# Patient Record
Sex: Female | Born: 1960
Health system: Southern US, Community
[De-identification: ages and names within clinical notes are randomized; demographics above are authoritative.]

## PROBLEM LIST (undated history)

## (undated) DIAGNOSIS — M797 Fibromyalgia: Secondary | ICD-10-CM

## (undated) DIAGNOSIS — I639 Cerebral infarction, unspecified: Secondary | ICD-10-CM

## (undated) DIAGNOSIS — R51 Headache: Secondary | ICD-10-CM

## (undated) DIAGNOSIS — F419 Anxiety disorder, unspecified: Secondary | ICD-10-CM

## (undated) DIAGNOSIS — H9190 Unspecified hearing loss, unspecified ear: Secondary | ICD-10-CM

## (undated) DIAGNOSIS — F32A Depression, unspecified: Secondary | ICD-10-CM

## (undated) DIAGNOSIS — I10 Essential (primary) hypertension: Secondary | ICD-10-CM

## (undated) DIAGNOSIS — F329 Major depressive disorder, single episode, unspecified: Secondary | ICD-10-CM

## (undated) DIAGNOSIS — E785 Hyperlipidemia, unspecified: Secondary | ICD-10-CM

## (undated) DIAGNOSIS — G4730 Sleep apnea, unspecified: Secondary | ICD-10-CM

## (undated) DIAGNOSIS — R519 Headache, unspecified: Secondary | ICD-10-CM

## (undated) DIAGNOSIS — E119 Type 2 diabetes mellitus without complications: Secondary | ICD-10-CM

## (undated) DIAGNOSIS — H409 Unspecified glaucoma: Secondary | ICD-10-CM

## (undated) HISTORY — DX: Unspecified glaucoma: H40.9

## (undated) HISTORY — DX: Fibromyalgia: M79.7

## (undated) HISTORY — DX: Anxiety disorder, unspecified: F41.9

## (undated) HISTORY — DX: Depression, unspecified: F32.A

## (undated) HISTORY — PX: EYE SURGERY: SHX253

## (undated) HISTORY — DX: Hyperlipidemia, unspecified: E78.5

## (undated) HISTORY — DX: Cerebral infarction, unspecified: I63.9

## (undated) HISTORY — DX: Sleep apnea, unspecified: G47.30

## (undated) HISTORY — DX: Major depressive disorder, single episode, unspecified: F32.9

## (undated) HISTORY — DX: Unspecified hearing loss, unspecified ear: H91.90

## (undated) LAB — HEPATIC FUNCTION PANEL
ALT: 21 (ref 7–35)
AST: 14 (ref 13–35)
Alkaline Phosphatase: 88 (ref 25–125)
BILIRUBIN, TOTAL: 0.2

## (undated) LAB — CBC AND DIFFERENTIAL
HCT: 40 (ref 36–46)
Hemoglobin: 13.8 (ref 12.0–16.0)
Platelets: 229 (ref 150–399)
WBC: 9.4

## (undated) LAB — HEMOGLOBIN A1C: HEMOGLOBIN A1C: 6.7

## (undated) LAB — LIPID PANEL
Cholesterol: 203 — AB (ref 0–200)
HDL: 59 (ref 35–70)
LDL Cholesterol: 119
Triglycerides: 127 (ref 40–160)

## (undated) LAB — BASIC METABOLIC PANEL
BUN: 20 (ref 4–21)
CREATININE: 0.9 (ref 0.5–1.1)
GLUCOSE: 119
POTASSIUM: 5 (ref 3.4–5.3)
Sodium: 141 (ref 137–147)

---
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
## 1980-03-26 HISTORY — PX: CHOLECYSTECTOMY: SHX55

## 1980-03-26 HISTORY — PX: APPENDECTOMY: SHX54

## 2005-03-26 HISTORY — PX: STAPEDES SURGERY: SHX789

## 2009-12-14 DIAGNOSIS — R7309 Other abnormal glucose: Secondary | ICD-10-CM | POA: Insufficient documentation

## 2014-03-26 HISTORY — PX: SHUNT REPLACEMENT: SHX5403

## 2014-11-05 DIAGNOSIS — G932 Benign intracranial hypertension: Secondary | ICD-10-CM | POA: Insufficient documentation

## 2015-03-15 DIAGNOSIS — R935 Abnormal findings on diagnostic imaging of other abdominal regions, including retroperitoneum: Secondary | ICD-10-CM | POA: Insufficient documentation

## 2015-03-22 DIAGNOSIS — Z982 Presence of cerebrospinal fluid drainage device: Secondary | ICD-10-CM | POA: Insufficient documentation

## 2015-04-12 LAB — LIPID PANEL
Cholesterol: 245 — AB (ref 0–200)
HDL: 51 (ref 35–70)
LDL CALC: 153
TRIGLYCERIDES: 206 — AB (ref 40–160)

## 2015-04-12 LAB — HEPATIC FUNCTION PANEL
ALT: 19 (ref 7–35)
AST: 18 (ref 13–35)
Alkaline Phosphatase: 75 (ref 25–125)
BILIRUBIN, TOTAL: 0.2

## 2015-04-12 LAB — BASIC METABOLIC PANEL
BUN: 14 (ref 4–21)
Creatinine: 0.8 (ref <=1.1)
Glucose: 181
POTASSIUM: 4.3 (ref 3.4–5.3)
SODIUM: 141 (ref 137–147)

## 2015-04-12 LAB — CBC AND DIFFERENTIAL
HEMATOCRIT: 41 (ref 36–46)
Hemoglobin: 13.4 (ref 12.0–16.0)
Platelets: 238 (ref 150–399)
WBC: 10.5

## 2015-04-12 LAB — HEMOGLOBIN A1C: HEMOGLOBIN A1C: 7.9

## 2015-07-12 LAB — CBC AND DIFFERENTIAL
HEMATOCRIT: 41 (ref 36–46)
HEMOGLOBIN: 13.9 (ref 12.0–16.0)
PLATELETS: 216 (ref 150–399)
WBC: 8.4

## 2015-07-12 LAB — BASIC METABOLIC PANEL
BUN: 18 (ref 4–21)
Creatinine: 0.9 (ref <=1.1)
GLUCOSE: 146
Potassium: 4.3 (ref 3.4–5.3)
Sodium: 140 (ref 137–147)

## 2015-07-12 LAB — LIPID PANEL
CHOLESTEROL: 197 (ref 0–200)
HDL: 57 (ref 35–70)
LDL CALC: 117
Triglycerides: 113 (ref 40–160)

## 2015-07-12 LAB — HEPATIC FUNCTION PANEL
ALK PHOS: 75 (ref 25–125)
ALT: 16 (ref 7–35)
AST: 17 (ref 13–35)
Bilirubin, Total: 0.3

## 2015-07-12 LAB — HEMOGLOBIN A1C: Hemoglobin A1C: 7.5

## 2015-10-25 HISTORY — PX: VENTRICULOPERITONEAL SHUNT: SHX204

## 2016-01-17 LAB — CBC AND DIFFERENTIAL
HCT: 40 (ref 36–46)
Hemoglobin: 13.8 (ref 12.0–16.0)
PLATELETS: 229 (ref 150–399)
WBC: 9.4

## 2016-01-17 LAB — BASIC METABOLIC PANEL
BUN: 20 (ref 4–21)
CREATININE: 0.9 (ref <=1.1)
GLUCOSE: 119
Potassium: 5 (ref 3.4–5.3)
Sodium: 141 (ref 137–147)

## 2016-01-17 LAB — HEPATIC FUNCTION PANEL
ALK PHOS: 88 (ref 25–125)
ALT: 21 (ref 7–35)
AST: 14 (ref 13–35)
Bilirubin, Total: 0.2

## 2016-01-17 LAB — LIPID PANEL
Cholesterol: 203 — AB (ref 0–200)
HDL: 59 (ref 35–70)
LDL Cholesterol: 119
Triglycerides: 127 (ref 40–160)

## 2016-01-17 LAB — HEMOGLOBIN A1C: Hemoglobin A1C: 6.7

## 2016-01-18 LAB — CREATININE, URINE, RANDOM
CREATININE UR: 60.3 mg/dL
Microalbumin, Urine: <3

## 2016-03-26 HISTORY — PX: SHUNT REVISION: SHX343

## 2016-06-10 ENCOUNTER — Emergency Department (HOSPITAL_COMMUNITY): Payer: Self-pay

## 2016-06-10 ENCOUNTER — Inpatient Hospital Stay (HOSPITAL_COMMUNITY)
Admission: EM | Admit: 2016-06-10 | Discharge: 2016-06-12 | DRG: 062 | Disposition: A | Discharge status: Home / Self Care | Payer: Medicare Other | Attending: Neurology | Admitting: Neurology

## 2016-06-10 ENCOUNTER — Encounter (HOSPITAL_COMMUNITY): Payer: Self-pay | Admitting: Emergency Medicine

## 2016-06-10 DIAGNOSIS — Z79899 Other long term (current) drug therapy: Secondary | ICD-10-CM

## 2016-06-10 DIAGNOSIS — Z886 Allergy status to analgesic agent status: Secondary | ICD-10-CM

## 2016-06-10 DIAGNOSIS — Z23 Encounter for immunization: Secondary | ICD-10-CM

## 2016-06-10 DIAGNOSIS — R29704 NIHSS score 4: Secondary | ICD-10-CM | POA: Diagnosis present

## 2016-06-10 DIAGNOSIS — R402252 Coma scale, best verbal response, oriented, at arrival to emergency department: Secondary | ICD-10-CM | POA: Diagnosis present

## 2016-06-10 DIAGNOSIS — I160 Hypertensive urgency: Secondary | ICD-10-CM | POA: Diagnosis present

## 2016-06-10 DIAGNOSIS — I63311 Cerebral infarction due to thrombosis of right middle cerebral artery: Secondary | ICD-10-CM

## 2016-06-10 DIAGNOSIS — G919 Hydrocephalus, unspecified: Secondary | ICD-10-CM | POA: Diagnosis present

## 2016-06-10 DIAGNOSIS — E119 Type 2 diabetes mellitus without complications: Secondary | ICD-10-CM | POA: Diagnosis present

## 2016-06-10 DIAGNOSIS — R4781 Slurred speech: Secondary | ICD-10-CM | POA: Diagnosis present

## 2016-06-10 DIAGNOSIS — Y92239 Unspecified place in hospital as the place of occurrence of the external cause: Secondary | ICD-10-CM | POA: Diagnosis not present

## 2016-06-10 DIAGNOSIS — I639 Cerebral infarction, unspecified: Secondary | ICD-10-CM

## 2016-06-10 DIAGNOSIS — R402362 Coma scale, best motor response, obeys commands, at arrival to emergency department: Secondary | ICD-10-CM | POA: Diagnosis present

## 2016-06-10 DIAGNOSIS — Z7984 Long term (current) use of oral hypoglycemic drugs: Secondary | ICD-10-CM

## 2016-06-10 DIAGNOSIS — Z982 Presence of cerebrospinal fluid drainage device: Secondary | ICD-10-CM

## 2016-06-10 DIAGNOSIS — E1159 Type 2 diabetes mellitus with other circulatory complications: Secondary | ICD-10-CM

## 2016-06-10 DIAGNOSIS — R402142 Coma scale, eyes open, spontaneous, at arrival to emergency department: Secondary | ICD-10-CM | POA: Diagnosis present

## 2016-06-10 DIAGNOSIS — T45615A Adverse effect of thrombolytic drugs, initial encounter: Secondary | ICD-10-CM | POA: Diagnosis not present

## 2016-06-10 DIAGNOSIS — R2990 Unspecified symptoms and signs involving the nervous system: Secondary | ICD-10-CM | POA: Diagnosis present

## 2016-06-10 DIAGNOSIS — I69354 Hemiplegia and hemiparesis following cerebral infarction affecting left non-dominant side: Secondary | ICD-10-CM

## 2016-06-10 DIAGNOSIS — I10 Essential (primary) hypertension: Secondary | ICD-10-CM | POA: Diagnosis present

## 2016-06-10 DIAGNOSIS — Z6841 Body Mass Index (BMI) 40.0 and over, adult: Secondary | ICD-10-CM

## 2016-06-10 DIAGNOSIS — R600 Localized edema: Secondary | ICD-10-CM | POA: Diagnosis not present

## 2016-06-10 DIAGNOSIS — E1165 Type 2 diabetes mellitus with hyperglycemia: Secondary | ICD-10-CM

## 2016-06-10 DIAGNOSIS — F419 Anxiety disorder, unspecified: Secondary | ICD-10-CM | POA: Diagnosis present

## 2016-06-10 DIAGNOSIS — R29810 Facial weakness: Secondary | ICD-10-CM | POA: Diagnosis present

## 2016-06-10 DIAGNOSIS — E785 Hyperlipidemia, unspecified: Secondary | ICD-10-CM | POA: Diagnosis present

## 2016-06-10 HISTORY — DX: Headache: R51

## 2016-06-10 HISTORY — DX: Type 2 diabetes mellitus without complications: E11.9

## 2016-06-10 HISTORY — DX: Essential (primary) hypertension: I10

## 2016-06-10 HISTORY — DX: Headache, unspecified: R51.9

## 2016-06-10 LAB — DIFFERENTIAL
BASOS PCT: 1 %
Basophils Absolute: 0 10*3/uL (ref 0.0–0.1)
EOS ABS: 0.2 10*3/uL (ref 0.0–0.7)
Eosinophils Relative: 2 %
LYMPHS ABS: 3.3 10*3/uL (ref 0.7–4.0)
Lymphocytes Relative: 45 %
MONO ABS: 0.8 10*3/uL (ref 0.1–1.0)
MONOS PCT: 11 %
NEUTROS ABS: 3 10*3/uL (ref 1.7–7.7)
Neutrophils Relative %: 41 %

## 2016-06-10 LAB — I-STAT CHEM 8, ED
BUN: 18 mg/dL (ref 6–20)
CHLORIDE: 106 mmol/L (ref 101–111)
Calcium, Ion: 1.05 mmol/L — ABNORMAL LOW (ref 1.15–1.40)
Creatinine, Ser: 1 mg/dL (ref 0.44–1.00)
Glucose, Bld: 162 mg/dL — ABNORMAL HIGH (ref 65–99)
HCT: 36 % (ref 36.0–46.0)
Hemoglobin: 12.2 g/dL (ref 12.0–15.0)
POTASSIUM: 3.9 mmol/L (ref 3.5–5.1)
SODIUM: 140 mmol/L (ref 135–145)
TCO2: 27 mmol/L (ref 0–100)

## 2016-06-10 LAB — PROTIME-INR
INR: 1.02
Prothrombin Time: 13.4 seconds (ref 11.4–15.2)

## 2016-06-10 LAB — RAPID URINE DRUG SCREEN, HOSP PERFORMED
AMPHETAMINES: NOT DETECTED
BENZODIAZEPINES: NOT DETECTED
Barbiturates: NOT DETECTED
Cocaine: NOT DETECTED
OPIATES: NOT DETECTED
Tetrahydrocannabinol: NOT DETECTED

## 2016-06-10 LAB — URINALYSIS, ROUTINE W REFLEX MICROSCOPIC
Bilirubin Urine: NEGATIVE
Glucose, UA: NEGATIVE mg/dL
Hgb urine dipstick: NEGATIVE
Ketones, ur: NEGATIVE mg/dL
Leukocytes, UA: NEGATIVE
Nitrite: NEGATIVE
PROTEIN: NEGATIVE mg/dL
RBC / HPF: NONE SEEN RBC/hpf (ref 0–5)
Specific Gravity, Urine: 1.006 (ref 1.005–1.030)
pH: 6 (ref 5.0–8.0)

## 2016-06-10 LAB — CBG MONITORING, ED: GLUCOSE-CAPILLARY: 155 mg/dL — AB (ref 65–99)

## 2016-06-10 LAB — CBC
HEMATOCRIT: 38 % (ref 36.0–46.0)
HEMOGLOBIN: 12.5 g/dL (ref 12.0–15.0)
MCH: 29.4 pg (ref 26.0–34.0)
MCHC: 32.9 g/dL (ref 30.0–36.0)
MCV: 89.4 fL (ref 78.0–100.0)
Platelets: 168 10*3/uL (ref 150–400)
RBC: 4.25 MIL/uL (ref 3.87–5.11)
RDW: 13.5 % (ref 11.5–15.5)
WBC: 7.2 10*3/uL (ref 4.0–10.5)

## 2016-06-10 LAB — GLUCOSE, CAPILLARY
Glucose-Capillary: 89 mg/dL (ref 65–99)
Glucose-Capillary: 305 mg/dL — ABNORMAL HIGH (ref 65–99)

## 2016-06-10 LAB — COMPREHENSIVE METABOLIC PANEL
ALT: 20 U/L (ref 14–54)
AST: 23 U/L (ref 15–41)
Albumin: 3.6 g/dL (ref 3.5–5.0)
Alkaline Phosphatase: 52 U/L (ref 38–126)
Anion gap: 12 (ref 5–15)
BILIRUBIN TOTAL: 0.2 mg/dL — AB (ref 0.3–1.2)
BUN: 15 mg/dL (ref 6–20)
CALCIUM: 8.8 mg/dL — AB (ref 8.9–10.3)
CO2: 20 mmol/L — AB (ref 22–32)
Chloride: 106 mmol/L (ref 101–111)
Creatinine, Ser: 1.11 mg/dL — ABNORMAL HIGH (ref 0.44–1.00)
GFR calc non Af Amer: 54 mL/min — ABNORMAL LOW (ref >60)
GLUCOSE: 165 mg/dL — AB (ref 65–99)
POTASSIUM: 3.8 mmol/L (ref 3.5–5.1)
Sodium: 138 mmol/L (ref 135–145)
Total Protein: 6.2 g/dL — ABNORMAL LOW (ref 6.5–8.1)

## 2016-06-10 LAB — I-STAT TROPONIN, ED: Troponin i, poc: 0 ng/mL (ref 0.00–0.08)

## 2016-06-10 LAB — ETHANOL: Alcohol, Ethyl (B): <5 mg/dL (ref <5)

## 2016-06-10 LAB — MRSA PCR SCREENING: MRSA by PCR: NEGATIVE

## 2016-06-10 LAB — APTT: APTT: 27 s (ref 24–36)

## 2016-06-10 MED ORDER — ALTEPLASE (STROKE) FULL DOSE INFUSION
90.0000 mg | Freq: Once | INTRAVENOUS | Status: AC
  Administered 2016-06-10: 90 mg via INTRAVENOUS
  Filled 2016-06-10: qty 100

## 2016-06-10 MED ORDER — STROKE: EARLY STAGES OF RECOVERY BOOK
Freq: Once | Status: AC
  Administered 2016-06-10: 14:00:00
  Filled 2016-06-10: qty 1

## 2016-06-10 MED ORDER — ACETAMINOPHEN 325 MG PO TABS
650.0000 mg | ORAL_TABLET | ORAL | Status: DC | PRN
  Administered 2016-06-10: 650 mg via ORAL
  Filled 2016-06-10: qty 2

## 2016-06-10 MED ORDER — METHYLPREDNISOLONE SODIUM SUCC 125 MG IJ SOLR
60.0000 mg | Freq: Once | INTRAMUSCULAR | Status: AC
  Administered 2016-06-10: 60 mg via INTRAVENOUS

## 2016-06-10 MED ORDER — DIPHENHYDRAMINE HCL 50 MG/ML IJ SOLN
25.0000 mg | Freq: Once | INTRAMUSCULAR | Status: AC
  Administered 2016-06-10: 25 mg via INTRAVENOUS

## 2016-06-10 MED ORDER — SODIUM CHLORIDE 0.9 % IV SOLN
50.0000 mL | Freq: Once | INTRAVENOUS | Status: AC
  Administered 2016-06-10: 50 mL via INTRAVENOUS

## 2016-06-10 MED ORDER — PANTOPRAZOLE SODIUM 40 MG IV SOLR: 40.0000 mg | Freq: Every day | INTRAVENOUS | Status: DC

## 2016-06-10 MED ORDER — LORAZEPAM 1 MG PO TABS
1.0000 mg | ORAL_TABLET | Freq: Two times a day (BID) | ORAL | Status: DC | PRN
  Administered 2016-06-10: 1 mg via ORAL
  Filled 2016-06-10: qty 1

## 2016-06-10 MED ORDER — ACETAMINOPHEN 160 MG/5ML PO SOLN: 650.0000 mg | ORAL | Status: DC | PRN

## 2016-06-10 MED ORDER — PNEUMOCOCCAL VAC POLYVALENT 25 MCG/0.5ML IJ INJ
0.5000 mL | INJECTION | INTRAMUSCULAR | Status: AC | PRN
  Administered 2016-06-12: 0.5 mL via INTRAMUSCULAR
  Filled 2016-06-10: qty 0.5

## 2016-06-10 MED ORDER — BACLOFEN 10 MG PO TABS
10.0000 mg | ORAL_TABLET | Freq: Three times a day (TID) | ORAL | Status: DC | PRN
  Administered 2016-06-10 – 2016-06-11 (×2): 10 mg via ORAL
  Filled 2016-06-10 (×2): qty 1

## 2016-06-10 MED ORDER — PREGABALIN 50 MG PO CAPS
100.0000 mg | ORAL_CAPSULE | Freq: Two times a day (BID) | ORAL | Status: DC
  Administered 2016-06-11 – 2016-06-12 (×3): 100 mg via ORAL
  Filled 2016-06-10 (×4): qty 2

## 2016-06-10 MED ORDER — INSULIN ASPART 100 UNIT/ML ~~LOC~~ SOLN
0.0000 [IU] | Freq: Three times a day (TID) | SUBCUTANEOUS | Status: DC
Start: 2016-06-11 — End: 2016-06-12
  Administered 2016-06-11: 3 [IU] via SUBCUTANEOUS
  Administered 2016-06-11 – 2016-06-12 (×2): 2 [IU] via SUBCUTANEOUS

## 2016-06-10 MED ORDER — ACETAMINOPHEN 650 MG RE SUPP: 650.0000 mg | RECTAL | Status: DC | PRN

## 2016-06-10 MED ORDER — DIPHENHYDRAMINE HCL 50 MG/ML IJ SOLN
INTRAMUSCULAR | Status: AC
  Filled 2016-06-10: qty 1

## 2016-06-10 MED ORDER — PANTOPRAZOLE SODIUM 40 MG PO TBEC
40.0000 mg | DELAYED_RELEASE_TABLET | Freq: Every day | ORAL | Status: DC
  Administered 2016-06-10 – 2016-06-12 (×3): 40 mg via ORAL
  Filled 2016-06-10 (×3): qty 1

## 2016-06-10 MED ORDER — WHITE PETROLATUM GEL
Status: AC
  Filled 2016-06-10: qty 1

## 2016-06-10 MED ORDER — INSULIN ASPART 100 UNIT/ML ~~LOC~~ SOLN
0.0000 [IU] | Freq: Every day | SUBCUTANEOUS | Status: DC
  Administered 2016-06-10: 4 [IU] via SUBCUTANEOUS

## 2016-06-10 MED ORDER — METHYLPREDNISOLONE SODIUM SUCC 125 MG IJ SOLR
INTRAMUSCULAR | Status: AC
  Filled 2016-06-10: qty 2

## 2016-06-10 NOTE — Progress Notes (Signed)
Pt c/o swollen lips- bottom lip noted to be edematous. tpa completed already. MD notified, saw patient at bedside, ordered benadryl and solu medrol IV STAT. Will cont to monitor.

## 2016-06-10 NOTE — ED Notes (Signed)
Report given to Tammy RN  

## 2016-06-10 NOTE — ED Notes (Signed)
Patient presents to the ED with complaints of Left side weakness, facial droop, and slurred speech. Patient reports started at 1100. Patient complains of Headache behind the eye and back of head. Patient alert and oriented. Per EMS patient Diabetic Patient CBG initial  65 given d10 CBG 193. Patient initial BP with EMS 210/140. Patient does have Shunt placed on right side in 2016. Patient reports she gets headache with shunt but does not feel the same.

## 2016-06-10 NOTE — ED Provider Notes (Signed)
[facility]EMERGENCY DEPT Provider Note   CSN: [id number] Arrival date & time: 06/10/16  1206     History   Chief Complaint No chief complaint on file.   HPI Melissa Rosario is a 56 y.o. female. Chief complaint is "code stroke". Left-sided weakness.  HPI:  56 year female history of hypertension, hydrocephalus, status post VP shunt placement August 2017 at [facility] in [place]  in [place].   Last time known normal was 11:00. Patient describes left-sided headache. Left arm and leg weakness. Paramedics describe facial droop.  Prehospital blood sugar was 68. Given D10. No change in symptoms. Hypertensive 180/110. No treatment initiated prehospital. Sinus rhythm.  No past medical history on file.  There are no active problems to display for this patient.   No past surgical history on file.  OB History    No data available       Home Medications    Prior to Admission medications   Medication Sig Start Date End Date Taking? Authorizing Provider  baclofen (LIORESAL) 10 MG tablet Take 10 mg by mouth 3 (three) times daily.   Yes Historical Provider, MD  glipiZIDE (GLUCOTROL) 10 MG tablet Take 10 mg by mouth daily before breakfast.   Yes Historical Provider, MD  lisinopril (PRINIVIL,ZESTRIL) 10 MG tablet Take 10 mg by mouth daily.   Yes Historical Provider, MD  pregabalin (LYRICA) 100 MG capsule Take 100 mg by mouth 2 (two) times daily.   Yes Historical Provider, MD    Family History No family history on file.  Social History Social History  Substance Use Topics  . Smoking status: Not on file  . Smokeless tobacco: Not on file  . Alcohol use Not on file     Allergies   Patient has no allergy information on record.   Review of Systems Review of Systems  Constitutional: Negative for appetite change, chills, diaphoresis, fatigue and fever.  HENT: Negative for mouth sores, sore throat and trouble swallowing.   Eyes: Negative for visual disturbance.  Respiratory: Negative  for cough, chest tightness, shortness of breath and wheezing.   Cardiovascular: Negative for chest pain.  Gastrointestinal: Negative for abdominal distention, abdominal pain, diarrhea, nausea and vomiting.  Endocrine: Negative for polydipsia, polyphagia and polyuria.  Genitourinary: Negative for dysuria, frequency and hematuria.  Musculoskeletal: Negative for gait problem.  Skin: Negative for color change, pallor and rash.  Neurological: Negative for dizziness, syncope, light-headedness and headaches.       Left arm and leg weakness. No fall. No seizure.  Hematological: Does not bruise/bleed easily.  Psychiatric/Behavioral: Negative for behavioral problems and confusion.     Physical Exam Updated Vital Signs BP (!) 177/92 (BP Location: Right Arm)   Pulse 76   Resp 14   SpO2 99%   Physical Exam  Constitutional: She is oriented to person, place, and time. She appears well-developed and well-nourished. No distress.  HENT:  Head: Normocephalic.  Eyes: Conjunctivae are normal. Pupils are equal, round, and reactive to light. No scleral icterus.  Neck: Normal range of motion. Neck supple. No thyromegaly present.  Cardiovascular: Normal rate and regular rhythm.  Exam reveals no gallop and no friction rub.   No murmur heard. Pulmonary/Chest: Effort normal and breath sounds normal. No respiratory distress. She has no wheezes. She has no rales.  Abdominal: Soft. Bowel sounds are normal. She exhibits no distension. There is no tenderness. There is no rebound.  Musculoskeletal: Normal range of motion.  Neurological: She is alert and oriented to person, place, and  time. GCS eye subscore is 4. GCS verbal subscore is 5. GCS motor subscore is 6.  Subtle Left lower facial droop. Has left arm drift. No obvious leg drift. Legs 4 over 5 bilaterally.  Skin: Skin is warm and dry. No rash noted.  Psychiatric: She has a normal mood and affect. Her behavior is normal.     ED Treatments / Results   Labs (all labs ordered are listed, but only abnormal results are displayed) Labs Reviewed  CBG MONITORING, ED - Abnormal; Notable for the following:       Result Value   Glucose-Capillary 155 (*)    All other components within normal limits  I-STAT CHEM 8, ED - Abnormal; Notable for the following:    Glucose, Bld 162 (*)    Calcium, Ion 1.05 (*)    All other components within normal limits  PROTIME-INR  APTT  CBC  DIFFERENTIAL  COMPREHENSIVE METABOLIC PANEL  ETHANOL  RAPID URINE DRUG SCREEN, HOSP PERFORMED  URINALYSIS, ROUTINE W REFLEX MICROSCOPIC  I-STAT TROPOININ, ED  I-STAT CHEM 8, ED  I-STAT TROPOININ, ED    EKG  EKG Interpretation None       Radiology Ct Head Code Stroke W/o Cm  Result Date: 06/10/2016 CLINICAL DATA:  Code stroke. Left-sided weakness. Right facial droop. Slurred speech. Last seen normal 90 minutes ago. EXAM: CT HEAD WITHOUT CONTRAST TECHNIQUE: Contiguous axial images were obtained from the base of the skull through the vertex without intravenous contrast. COMPARISON:  None. FINDINGS: Brain: No sign of acute infarction. No mass lesion, hemorrhage, hydrocephalus or extra-axial collection. VP shunt enters from a right frontal approach. Vascular: No abnormal vascular finding. Skull: Negative Sinuses/Orbits: Clear/normal Other: None ASPECTS (Alberta Stroke Program Early CT Score) - Ganglionic level infarction (caudate, lentiform nuclei, internal capsule, insula, M1-M3 cortex): 7 - Supraganglionic infarction (M4-M6 cortex): 3 Total score (0-10 with 10 being normal): 10 IMPRESSION: 1. No acute finding.  Right VP shunt without hydrocephalus. 2. ASPECTS is 10. These results were called by telephone at the time of interpretation on 06/10/2016 at 12:26 pm to Dr. Cena BentonVega, who verbally acknowledged these results. Electronically Signed   By: Paulina FusiMark  Shogry M.D.   On: 06/10/2016 12:28    Procedures Procedures (including critical care time)  Medications Ordered in  ED Medications - No data to display   Initial Impression / Assessment and Plan / ED Course  I have reviewed the triage vital signs and the nursing notes.  Pertinent labs & imaging results that were available during my care of the patient were reviewed by me and considered in my medical decision making (see chart for details).   differential diagnosis CNS hemorrhage, hypertensive urgency, acute ischemic stroke. Plan emergent CT. Neurologic consultation. Blood pressure treatment will depend upon etiology of symptoms.  Final Clinical Impressions(s) / ED Diagnoses   Final diagnoses:  Cerebrovascular accident (CVA) due to thrombosis of right middle cerebral artery (HCC)    Pt with VP shunt, last revised 8 months ago. BP here 177/92. No additional absolute or relative contraindications to TPa. Pt seen by Dr. Cena BentonVega, Neruology. Recommendation is IV TPa. Family and patient give consent.   CRITICAL CARE Performed by: Rolland PorterJAMES, Yanelle Sousa JOSEPH   Total critical care time: 20 minutes  Critical care time was exclusive of separately billable procedures and treating other patients.  Critical care was necessary to treat or prevent imminent or life-threatening deterioration.  Critical care was time spent personally by me on the following activities: development of treatment plan  with patient and/or surrogate as well as nursing, discussions with consultants, evaluation of patient's response to treatment, examination of patient, obtaining history from patient or surrogate, ordering and performing treatments and interventions, ordering and review of laboratory studies, ordering and review of radiographic studies, pulse oximetry and re-evaluation of patient's condition. care  New Prescriptions New Prescriptions   No medications on file     Rolland Porter, MD 06/10/16 1240

## 2016-06-10 NOTE — Consult Note (Signed)
Neurology Consultation Reason for Consult: Code stroke Referring Physician:   CC: headache and slurred speech, left sided weakness  History is obtained from:patient and family  HPI: Melissa Rosario is a 56 y.o. female with a hx of VP shunt placed 2 years ago.  She was last normal at 11am speaking with family, then suddenly developed left retroorbital pain and left facial numbness and slurred speech.  She was rushed to the ED as a code stroke. I did not receive a call when the patient arrived at the hospital and only found the pt after her CT had been completed.  On my assessment she had an NIHSS = 4 and no contraindications to tPA. I asked emphatically whether her weakness was chronic and she denied this.  She also does not take any anticoagulation.  I discussed risks and benefits of treatment including the possibility of intracranial bleeding; pt agreed to be treated.     LKW: 11am tpa given?: yes - NIHSS = 4   ROS: A 14 point ROS was performed and is negative except as noted in the HPI.  No past medical history on file.  No family history on file.   Social History:  has no tobacco, alcohol, and drug history on file.  Exam: Current vital signs: BP (!) 160/76   Pulse 66   Resp 17   Wt 101 kg (222 lb 10.6 oz)   SpO2 98%  Vital signs in last 24 hours: Pulse Rate:  [63-92] 66 (03/18 1250) Resp:  [11-20] 17 (03/18 1250) BP: (160-179)/(76-92) 160/76 (03/18 1250) SpO2:  [98 %-100 %] 98 % (03/18 1250) Weight:  [101 kg (222 lb 10.6 oz)] 101 kg (222 lb 10.6 oz) (03/18 1245)   Physical Exam  Constitutional: Appears well-developed and well-nourished.  Psych: Affect appropriate to situation Eyes: No scleral injection HENT: No OP obstrucion Head: Normocephalic.  Cardiovascular: Normal rate and regular rhythm.  Respiratory: Effort normal and breath sounds normal to anterior ascultation GI: Soft.  No distension. There is no tenderness.  Skin: WDI  Neuro: Mental Status: Patient is  awake, alert, oriented to person, place, month, year. She is slightly dysarthric Patient is able to give a clear and coherent history No signs of aphasia or neglect Cranial Nerves: II: Visual Fields are full. Pupils are equal, round, and reactive to light.  III,IV, VI: EOMI without ptosis or diploplia.  V: Facial sensation is symmetric to temperature VII: minor left sided facial asymmetry. VIII: hearing is intact to voice X: Uvula elevates symmetrically XI: Shoulder shrug is symmetric. XII: tongue is midline without atrophy or fasciculations.  Motor: Tone is normal. Bulk is normal. Left side is weaker than left on confrontation and she does have arm and left drift Sensory: Sensation is symmetric to light touch and temperature in the arms and legs Deep Tendon Reflexes: 2+ and symmetric in the biceps and patellae Plantars: Toes are downgoing bilaterally. Cerebellar: FNF and HKS are intact bilaterally  I personally reviewed the head ct and the labs.  All wnl for tPA treatment A/P: Possible small right MCA territory stroke, likely subcortical vs headache with brainstem involvement.  Will attempt to get MRI. Pt admitted to the unit.  Stroke w/u. SCDs. Stroke to see pt in am.

## 2016-06-11 ENCOUNTER — Inpatient Hospital Stay (HOSPITAL_COMMUNITY): Payer: Self-pay

## 2016-06-11 ENCOUNTER — Encounter (HOSPITAL_COMMUNITY): Payer: Self-pay | Admitting: *Deleted

## 2016-06-11 ENCOUNTER — Inpatient Hospital Stay (HOSPITAL_COMMUNITY): Payer: Medicare Other

## 2016-06-11 ENCOUNTER — Other Ambulatory Visit (HOSPITAL_COMMUNITY): Payer: Self-pay

## 2016-06-11 DIAGNOSIS — I6300 Cerebral infarction due to thrombosis of unspecified precerebral artery: Secondary | ICD-10-CM | POA: Diagnosis not present

## 2016-06-11 LAB — GLUCOSE, CAPILLARY
GLUCOSE-CAPILLARY: 135 mg/dL — AB (ref 65–99)
Glucose-Capillary: 156 mg/dL — ABNORMAL HIGH (ref 65–99)
Glucose-Capillary: 138 mg/dL — ABNORMAL HIGH (ref 65–99)
Glucose-Capillary: 194 mg/dL — ABNORMAL HIGH (ref 65–99)

## 2016-06-11 LAB — LIPID PANEL
CHOLESTEROL: 198 mg/dL (ref 0–200)
HDL: 57 mg/dL (ref >40)
LDL Cholesterol: 127 mg/dL — ABNORMAL HIGH (ref 0–99)
Total CHOL/HDL Ratio: 3.5 RATIO
Triglycerides: 70 mg/dL (ref <150)
VLDL: 14 mg/dL (ref 0–40)

## 2016-06-11 LAB — HIV ANTIBODY (ROUTINE TESTING W REFLEX): HIV Screen 4th Generation wRfx: NONREACTIVE

## 2016-06-11 MED ORDER — GLIPIZIDE ER 10 MG PO TB24
10.0000 mg | ORAL_TABLET | Freq: Every day | ORAL | Status: DC
  Administered 2016-06-12: 10 mg via ORAL
  Filled 2016-06-11: qty 1

## 2016-06-11 MED ORDER — LISINOPRIL 20 MG PO TABS
20.0000 mg | ORAL_TABLET | Freq: Every day | ORAL | Status: DC
  Administered 2016-06-11 – 2016-06-12 (×2): 20 mg via ORAL
  Filled 2016-06-11 (×2): qty 1

## 2016-06-11 MED ORDER — BRIMONIDINE TARTRATE 0.15 % OP SOLN
1.0000 [drp] | Freq: Three times a day (TID) | OPHTHALMIC | Status: DC
  Administered 2016-06-11 – 2016-06-12 (×3): 1 [drp] via OPHTHALMIC
  Filled 2016-06-11: qty 5

## 2016-06-11 MED ORDER — HYDROCHLOROTHIAZIDE 25 MG PO TABS
25.0000 mg | ORAL_TABLET | Freq: Every day | ORAL | Status: DC
  Administered 2016-06-11 – 2016-06-12 (×2): 25 mg via ORAL
  Filled 2016-06-11 (×2): qty 1

## 2016-06-11 MED ORDER — LATANOPROST 0.005 % OP SOLN
1.0000 [drp] | Freq: Every day | OPHTHALMIC | Status: DC
Start: 2016-06-11 — End: 2016-06-12
  Administered 2016-06-11: 1 [drp] via OPHTHALMIC
  Filled 2016-06-11: qty 2.5

## 2016-06-11 MED ORDER — BACLOFEN 10 MG PO TABS
10.0000 mg | ORAL_TABLET | Freq: Three times a day (TID) | ORAL | Status: DC
  Administered 2016-06-11 – 2016-06-12 (×2): 10 mg via ORAL
  Filled 2016-06-11 (×2): qty 1

## 2016-06-11 MED ORDER — ASPIRIN EC 81 MG PO TBEC
81.0000 mg | DELAYED_RELEASE_TABLET | Freq: Every day | ORAL | Status: DC
  Administered 2016-06-11 – 2016-06-12 (×2): 81 mg via ORAL
  Filled 2016-06-11 (×2): qty 1

## 2016-06-11 MED ORDER — LORAZEPAM 2 MG/ML IJ SOLN
1.0000 mg | Freq: Once | INTRAMUSCULAR | Status: AC
  Administered 2016-06-11: 1 mg via INTRAVENOUS
  Filled 2016-06-11: qty 1

## 2016-06-11 MED ORDER — ATORVASTATIN CALCIUM 40 MG PO TABS
40.0000 mg | ORAL_TABLET | Freq: Every day | ORAL | Status: DC
  Administered 2016-06-11: 40 mg via ORAL
  Filled 2016-06-11: qty 1

## 2016-06-11 MED ORDER — LORAZEPAM 1 MG PO TABS
1.0000 mg | ORAL_TABLET | Freq: Three times a day (TID) | ORAL | Status: DC
  Administered 2016-06-11 – 2016-06-12 (×3): 1 mg via ORAL
  Filled 2016-06-11 (×3): qty 1

## 2016-06-11 MED ORDER — LIFITEGRAST 5 % OP SOLN: 1.0000 [drp] | Freq: Two times a day (BID) | OPHTHALMIC | Status: DC

## 2016-06-11 MED ORDER — AMLODIPINE BESYLATE 10 MG PO TABS
10.0000 mg | ORAL_TABLET | Freq: Every day | ORAL | Status: DC
  Administered 2016-06-11 – 2016-06-12 (×2): 10 mg via ORAL
  Filled 2016-06-11 (×2): qty 1

## 2016-06-11 NOTE — Procedures (Signed)
I was called regarding Melissa Rosario having recently had an MRI. This was ordered by the neurology department. [name] has a [employer] programmable ventriculoperitoneal shunt. She believes her pressure was set to 150 mm of water. The MRI was completed this afternoon. This evening I have set her opening pressure of the shunt to 120 mm of water. She seems to be tolerating this well.

## 2016-06-11 NOTE — Evaluation (Signed)
Physical Therapy Evaluation Patient Details Name: Melissa Rosario MRN: [id number] DOB: [birth year] Today's Date: 06/11/2016   History of Present Illness  Pt is a 56 y.o. female admitted to ED on 06/10/16 with L retroorbital pain, L facial numbness and slurred speech. CT 3/18 shows no acute findings; MRI pending. Pertinent PMH includes shunt placement 10/2015, obesity, DM, HTN.   Clinical Impression  Pt presents to PT with L-side weakness, impaired balance, slowed processing, and an overall decrease in functional mobility secondary to above. PTA indep and living at home with husband; pt notes L-side weakness and slowed thinking since VP shunt placement in 10/2015, she is unsure if she is presenting any worse than baseline. Today, pt able to amb in hallway with 2x self-corrected LOB to L-side; reporting difficulty clearing L foot from ground. Pt would benefit from continued acute PT services to maximize functional mobility and independence.    Follow Up Recommendations Outpatient PT;Supervision for mobility/OOB    Equipment Recommendations  None recommended by PT    Recommendations for Other Services OT consult     Precautions / Restrictions Precautions Precautions: Fall Restrictions Weight Bearing Restrictions: No      Mobility  Bed Mobility Overal bed mobility: Needs Assistance Bed Mobility: Supine to Sit     Supine to sit: Supervision;HOB elevated        Transfers Overall transfer level: Needs assistance Equipment used: None Transfers: Sit to/from Stand Sit to Stand: Min guard         General transfer comment: Min guard for balance  Ambulation/Gait Ambulation/Gait assistance: Min guard Ambulation Distance (Feet): 150 Feet Assistive device: None Gait Pattern/deviations: Narrow base of support;Staggering left;Step-through pattern;Decreased stride length Gait velocity: Decreased Gait velocity interpretation: Below normal speed for age/gender General Gait Details: Amb  with min guard for balance; significantly slowed gait and narrow BOS, looking at feet throughout. Pt reports difficulty clearing L foot step with significant concentration on not falling; encouraged to widen BOS and increase gait speed, which slightly improved balance. 2x self-corrected LOB to L-side.   Stairs            Wheelchair Mobility    Modified Rankin (Stroke Patients Only) Modified Rankin (Stroke Patients Only) Pre-Morbid Rankin Score: No significant disability Modified Rankin: Moderate disability     Balance Overall balance assessment: Needs assistance Sitting-balance support: No upper extremity supported;Feet supported Sitting balance-Leahy Scale: Good     Standing balance support: No upper extremity supported;During functional activity Standing balance-Leahy Scale: Fair                               Pertinent Vitals/Pain Pain Assessment: No/denies pain    Home Living Family/patient expects to be discharged to:: Private residence Living Arrangements: Spouse/significant other Available Help at Discharge: Family;Available 24 hours/day (Husband) Type of Home: House Home Access: Stairs to enter Entrance Stairs-Rails: Can reach both Entrance Stairs-Number of Steps: 3 Home Layout: One level Home Equipment: None      Prior Function Level of Independence: Independent         Comments: Pt reports L sided weakness and slowed thinking since VP shunt placement 10/2015. Reports 1x recent fall at home (tripping over rug). Husband reports pt has glaucoma and only drives during day.      Hand Dominance        Extremity/Trunk Assessment   Upper Extremity Assessment Upper Extremity Assessment: LUE deficits/detail LUE Deficits / Details: Shoulder ABD 2+/5; rest of  LUE grossly 3/5 throughout. Decreased grip strength.     Lower Extremity Assessment Lower Extremity Assessment: LLE deficits/detail LLE Deficits / Details: Knee ext and ankle DF 2+/5;  rest of LLE grossly 3/5 throughout LLE Sensation: decreased light touch    Cervical / Trunk Assessment Cervical / Trunk Assessment: Normal  Communication   Communication: Expressive difficulties (Pt reports difficulty word finding)  Cognition Arousal/Alertness: Awake/alert Behavior During Therapy: WFL for tasks assessed/performed Overall Cognitive Status: Impaired/Different from baseline Area of Impairment: Following commands;Problem solving;Attention   Current Attention Level: Selective   Following Commands: Follows multi-step commands with increased time     Problem Solving: Slow processing General Comments: Pt reports her slowed processing seems similar to baseline, while husband notes a significant decline in pt's processing and "finding the right words".    General Comments      Exercises     Assessment/Plan    PT Assessment Patient needs continued PT services  PT Problem List Decreased strength;Decreased mobility;Impaired sensation;Decreased balance;Decreased cognition       PT Treatment Interventions Gait training;Therapeutic activities;Therapeutic exercise;Patient/family education;Balance training;Stair training;Functional mobility training;Neuromuscular re-education    PT Goals (Current goals can be found in the Care Plan section)  Acute Rehab PT Goals Patient Stated Goal: Return home PT Goal Formulation: With patient Time For Goal Achievement: 06/25/16 Potential to Achieve Goals: Good    Frequency Min 4X/week   Barriers to discharge        Co-evaluation               End of Session Equipment Utilized During Treatment: Gait belt Activity Tolerance: Patient tolerated treatment well Patient left: in bed;with family/visitor present;Other (comment);with nursing/sitter in room (for Doppler) Nurse Communication: Mobility status PT Visit Diagnosis: Unsteadiness on feet (R26.81);Other abnormalities of gait and mobility (R26.89)         Time:  7829-5621 PT Time Calculation (min) (ACUTE ONLY): 24 min   Charges:   PT Evaluation $PT Eval Low Complexity: 1 Procedure PT Treatments $Gait Training: 8-22 mins   PT G Codes:       Dewayne Hatch, SPT Office-[phone]  Melissa Rosario 06/11/2016, 12:54 PM

## 2016-06-11 NOTE — Progress Notes (Signed)
Info retrieved from Op note in Chart Review from 03/03/15:  PROCEDURE PERFORMED: 1. Insertion of right frontal ventriculoperitoneal shunt ([facility]) - set at 150 mmHg.  2. Use of BrainLab neuronavigation for guidance.   Manufacturer listed as [employer], [employer].

## 2016-06-11 NOTE — Progress Notes (Signed)
Pt asked for Nurse. I went into the room and had a 30 min conversation discussing medications & treatments she had received today. Pt begin to discuss her current health issues then became very tearful. Upon further questioning I determine that pt takes Ativan 1mg  twice a day. I notified physician and obtained an ativan order that mimics her home regiment. I was also told that pt has family obligations that she has to be available for by  Wednesday.

## 2016-06-11 NOTE — Progress Notes (Signed)
   06/11/16 1315  Clinical Encounter Type  Visited With Patient not available  Visit Type Other (Comment) ([place] consult)  Spiritual Encounters  Spiritual Needs Emotional  Stress Factors  Patient Stress Factors Not reviewed  Attempted consult visit to Pt. Nursing staff reported Pt at a procedure. Floor chaplain referred for follow up.

## 2016-06-11 NOTE — Progress Notes (Signed)
PT Cancellation Note  Patient Details Name: Idamae Lusherina Altidor MRN: [id number] DOB: Feb 09, 1961   Cancelled Treatment:    Reason Eval/Treat Not Completed: Medical issues which prohibited therapy. Pt on bedrest orders; will follow-up for PT eval when medically appropriate and as time allows.  Dewayne HatchJaclyn Leigh Arlyn Buerkle, SPT Office-[phone]  Ina HomesJaclyn Jazlen Ogarro 06/11/2016, 8:29 AM

## 2016-06-11 NOTE — Progress Notes (Signed)
*  PRELIMINARY RESULTS* Vascular Ultrasound Carotid Duplex (Doppler) has been completed.  Preliminary findings: Bilateral: No significant (1-39%) ICA stenosis. Antegrade vertebral flow.    Farrel DemarkJill Eunice, RDMS, RVT  06/11/2016, 11:52 AM

## 2016-06-11 NOTE — Progress Notes (Signed)
STROKE TEAM PROGRESS NOTE   SUBJECTIVE (INTERVAL HISTORY) Her husband Greggory Stallion is at the bedside.  She feels she is better, but not back to normal. Her son is in the [employer] - he is coming in Wednesday - she plans to go to [place] x 1 month to stay with her son's wife and kids (one who has significant medical issues) while he (son) goes to [place] for training. She is anxious to have her workup and leave the hospital. Dr. Pearlean Brownie encouraged her to have them find alternative arrangements. Continues to have L hemiparesis   OBJECTIVE Temp:  [97.8 F (36.6 C)-98.9 F (37.2 C)] 97.8 F (36.6 C) (03/19 0800) Pulse Rate:  [53-92] 77 (03/19 0900) Cardiac Rhythm: Normal sinus rhythm (03/19 0800) Resp:  [11-25] 15 (03/19 0900) BP: (93-179)/(38-104) 143/87 (03/19 0900) SpO2:  [91 %-100 %] 100 % (03/19 0900) Weight:  [97.5 kg (214 lb 15.2 oz)-101 kg (222 lb 10.6 oz)] 97.5 kg (214 lb 15.2 oz) (03/18 1330)  CBC:   Recent Labs Lab 06/10/16 1213 06/10/16 1217  WBC 7.2  --   NEUTROABS 3.0  --   HGB 12.5 12.2  HCT 38.0 36.0  MCV 89.4  --   PLT 168  --     Basic Metabolic Panel:   Recent Labs Lab 06/10/16 1213 06/10/16 1217  NA 138 140  K 3.8 3.9  CL 106 106  CO2 20*  --   GLUCOSE 165* 162*  BUN 15 18  CREATININE 1.11* 1.00  CALCIUM 8.8*  --    HgbA1c: No results found for: HGBA1C   PHYSICAL EXAM Pleasant middle aged caucasian lady not in distress. . Afebrile. Head is nontraumatic. Neck is supple without bruit.    Cardiac exam no murmur or gallop. Lungs are clear to auscultation. Distal pulses are well felt. Neurological Exam ;  Awake alert oriented x 3 normal speech and language. Mild left lower face asymmetry. Tongue midline. Mild left upper and lower extremity drift but variable efort. Mild diminished fine finger movements on left. Orbits right over left upper extremity. Mild left grip  And hip ,flexor weak.. Normal sensation . Normal coordination.  NIHSS 3   ASSESSMENT/PLAN Ms. Tineka Uriegas is a 56 y.o. female with history of HTN, DB, morbid obesity and hydrocepahlus presenting with left retroorbital pain and left facial numbness and slurred speech. She received IV t-PA 06/10/2016 at 1244.   Stroke:  R brain stroke s/p IV tPA, infarct secondary to unknown source, workup underway  Resultant  L hemiparesis  Code stroke CT no acute finding. R VP shunt w/o hydrocephalus. Aspects 10  MRI  Pending. Will give med oncall for anxiety  MRA  pending   Carotid Doppler  pending   2D Echo  pending   LDL 127  HgbA1c pending  SCDs for VTE prophylaxis Diet heart healthy/carb modified Room service appropriate? Yes; Fluid consistency: Thin  No antithrombotic prior to admission, now on No antithrombotic as within 24h of tPA administraion. If imaging at 24h, plan to start aspirin 3235 mg daily at that time  Therapy recommendations:  Pending. Ok to be OOB  Disposition:  pending   Hypertensive Urgency  BP 180/110 in setting of neurologic symptoms  SBP goal per post tPA guidelines  Stable Long-term BP goal normotensive  Hyperlipidemia  Home meds:  No statin  LDL 127 above goal  Add statin - lipitor 40 mg daily  Continue statin at discharge  Diabetes, type II  HgbA1c pending, goal <  7.0   Other Stroke Risk Factors  UDS negative  Morbid Obesity, Body mass index is 40.61 kg/m.  Other Active Problems  [facility]  Lower lip edema post tPA, treated with benadryl and solumedrol  Anxiety on ativan daily  Resume home medications once meds reviewed for accuracy.  Hospital day # 1  Rhoderick MoodyBIBY,SHARON  [facility] See Amion for Pager information 06/11/2016 9:41 AM  I have personally examined this patient, reviewed notes, independently viewed imaging studies, participated in medical decision making and plan of care.ROS completed by me personally and pertinent positives fully documented  I have made any additions or  clarifications directly to the above note. Agree with note above. . Presented with sudden onset of slurred speech and left hemiparesis due to suspected right brain subcortical infarct and received IV tPA and shown some improvement. She remains at risk for neurological worsening and post TPA hemorrhage. Continue aggressive blood pressure control and close neurological monitoring. Continue ongoing stroke workup. I had a long discussion the patient and husband and the bedside and explained plan for evaluation, treatment and answered questions.  This patient is critically ill and at significant risk of neurological worsening, death and care requires constant monitoring of vital signs, hemodynamics,respiratory and cardiac monitoring, extensive review of multiple databases, frequent neurological assessment, discussion with family, other specialists and medical decision making of high complexity.I have made any additions or clarifications directly to the above note.This critical care time does not reflect procedure time, or teaching time or supervisory time of PA/NP/Med Resident etc but could involve care discussion time.  I spent 30 minutes of neurocritical care time  in the care of  this patient.    Delia HeadyPramod Sethi, MD Medical Director [facility] Pager: [phone] 06/11/2016 3:59 PM  To contact Stroke Continuity provider, please refer to [email or web address]. After hours, contact General Neurology

## 2016-06-11 NOTE — Evaluation (Signed)
Occupational Therapy Evaluation Patient Details Name: Melissa Rosario MRN: [id number] DOB: 08/09/1960 Today's Date: 06/11/2016    History of Present Illness Pt is a 56 y.o. female admitted to ED on 06/10/16 with L retroorbital pain, L facial numbness, LUE weakness, and slurred speech. CT 3/18 shows no acute findings; MRI 3/19 negative as well. Pertinent PMH includes shunt placement 10/2015, obesity, DM, HTN.    Clinical Impression   This 56 yo female admitted with above presents to acute OT with deficits below (see OT problem list) thus affecting her PLOF of total independence with basic and IADLs. She will benefit from acute OT with follow up HHOT and 24 hour S/prn A    Follow Up Recommendations  Home health OT;Supervision/Assistance - 24 hour    Equipment Recommendations  Tub/shower seat       Precautions / Restrictions Precautions Precautions: Fall Restrictions Weight Bearing Restrictions: No      Mobility Bed Mobility Overal bed mobility: Needs Assistance Bed Mobility: Supine to Sit     Supine to sit: Supervision;HOB elevated        Transfers Overall transfer level: Needs assistance Equipment used: None Transfers: Sit to/from Stand Sit to Stand: Min guard         General transfer comment: ambulation to bathroom with min A    Balance Overall balance assessment: History of Falls Sitting-balance support: No upper extremity supported;Feet supported Sitting balance-Leahy Scale: Good     Standing balance support: No upper extremity supported;During functional activity Standing balance-Leahy Scale: Fair                              ADL Overall ADL's : Needs assistance/impaired Eating/Feeding: Independent;Sitting   Grooming: Minimal assistance;Standing   Upper Body Bathing: Minimal assistance;Sitting   Lower Body Bathing: Minimal assistance;Sit to/from stand   Upper Body Dressing : Minimal assistance;Sitting   Lower Body Dressing: Sit to/from  stand;Minimal assistance   Toilet Transfer: Minimal assistance;Ambulation;Regular Toilet;Grab bars   Toileting- Clothing Manipulation and Hygiene: Minimal assistance;Sit to/from stand               Vision Baseline Vision/History: Wears glasses Wears Glasses: Distance only Patient Visual Report: No change from baseline Vision Assessment?: Yes Eye Alignment: Within Functional Limits Ocular Range of Motion: Within Functional Limits Alignment/Gaze Preference: Within Defined Limits Tracking/Visual Pursuits: Able to track stimulus in all quads without difficulty Saccades: Within functional limits Convergence: Within functional limits Visual Fields: No apparent deficits            Pertinent Vitals/Pain Pain Assessment: 0-10 Pain Score: 8  Pain Location: head Pain Descriptors / Indicators: Aching Pain Intervention(s): Limited activity within patient's tolerance;Monitored during session;Premedicated before session     Hand Dominance Right   Extremity/Trunk Assessment Upper Extremity Assessment Upper Extremity Assessment: LUE deficits/detail LUE Deficits / Details: Shoulder ABD 2+/5; rest of LUE grossly 3/5 throughout. Decreased grip strength. Lag behind RUE as well as cannot maintain hold in place once placed (lowers slowly) LUE Sensation: decreased proprioception LUE Coordination: decreased gross motor;decreased fine motor     Communication Communication Communication: HOH;Expressive difficulties (pt reports word finding difficulties)   Cognition Arousal/Alertness: Awake/alert Behavior During Therapy: WFL for tasks assessed/performed Overall Cognitive Status: Impaired/Different from baseline Area of Impairment: Following commands;Problem solving;Attention   Current Attention Level: Selective   Following Commands: Follows multi-step commands with increased time     Problem Solving: Slow processing General Comments: Pt reports her slowed processing seems  similar to  baseline, while husband notes a significant decline in pt's processing and "finding the right words".              Home Living Family/patient expects to be discharged to:: Private residence Living Arrangements: Spouse/significant other Available Help at Discharge: Family;Available 24 hours/day (Husband) Type of Home: House Home Access: Stairs to enter Enter[employer] CorporationEntrance Stairs-Number of Steps: 3 Entrance Stairs-Rails: Can reach both Home Layout: One level     Bathroom Shower/Tub: Tub/shower unit         Home Equipment: None          Prior Functioning/Environment Level of Independence: Independent        Comments: Pt reports L sided weakness and slowed thinking since VP shunt placement 10/2015. Reports 1x recent fall at home (tripping over rug). Husband reports pt has glaucoma and only drives during day.         OT Problem List: Decreased strength;Decreased range of motion;Impaired tone;Obesity;Impaired balance (sitting and/or standing);Impaired UE functional use      OT Treatment/Interventions: Self-care/ADL training;Therapeutic activities;Therapeutic exercise;Patient/family education;DME and/or AE instruction;Balance training    OT Goals(Current goals can be found in the care plan section) Acute Rehab OT Goals Patient Stated Goal: Return home OT Goal Formulation: With patient/family Time For Goal Achievement: 06/18/16 Potential to Achieve Goals: Good  OT Frequency: Min 3X/week              End of Session Nurse Communication:  (pt asking about shower, BP meds, glaucoma meds, DM meds)  Activity Tolerance: Patient tolerated treatment well Patient left: in chair;with call bell/phone within reach;with family/visitor present  OT Visit Diagnosis: Unsteadiness on feet (R26.81);History of falling (Z91.81);Other symptoms and signs involving the nervous system (R29.898)                ADL either performed or assessed with clinical judgement  Time: 1433-1500 OT Time  Calculation (min): 27 min Charges:  OT General Charges $OT Visit: 1 Procedure OT Evaluation $OT Eval Moderate Complexity: 1 Procedure OT Treatments $Self Care/Home Management : 8-22 mins   Evette GeorgesLeonard, Judie Hollick Eva 06/11/2016, 3:14 PM [phone]

## 2016-06-12 ENCOUNTER — Inpatient Hospital Stay (HOSPITAL_COMMUNITY): Payer: Medicare Other

## 2016-06-12 DIAGNOSIS — F419 Anxiety disorder, unspecified: Secondary | ICD-10-CM | POA: Diagnosis present

## 2016-06-12 DIAGNOSIS — I6789 Other cerebrovascular disease: Secondary | ICD-10-CM

## 2016-06-12 DIAGNOSIS — E1159 Type 2 diabetes mellitus with other circulatory complications: Secondary | ICD-10-CM

## 2016-06-12 DIAGNOSIS — I639 Cerebral infarction, unspecified: Secondary | ICD-10-CM | POA: Diagnosis not present

## 2016-06-12 DIAGNOSIS — E1165 Type 2 diabetes mellitus with hyperglycemia: Secondary | ICD-10-CM

## 2016-06-12 DIAGNOSIS — R600 Localized edema: Secondary | ICD-10-CM | POA: Diagnosis present

## 2016-06-12 DIAGNOSIS — E785 Hyperlipidemia, unspecified: Secondary | ICD-10-CM | POA: Diagnosis present

## 2016-06-12 DIAGNOSIS — I160 Hypertensive urgency: Secondary | ICD-10-CM | POA: Diagnosis present

## 2016-06-12 DIAGNOSIS — G919 Hydrocephalus, unspecified: Secondary | ICD-10-CM | POA: Diagnosis present

## 2016-06-12 LAB — VAS US CAROTID
LEFT ECA DIAS: -10 cm/s
LEFT VERTEBRAL DIAS: 12 cm/s
Left CCA dist dias: -21 cm/s
Left CCA dist sys: -68 cm/s
Left CCA prox dias: 19 cm/s
Left CCA prox sys: 107 cm/s
Left ICA dist dias: -27 cm/s
Left ICA dist sys: -93 cm/s
Left ICA prox dias: -27 cm/s
Left ICA prox sys: -73 cm/s
RCCAPDIAS: -12 cm/s
RIGHT ECA DIAS: -14 cm/s
RIGHT VERTEBRAL DIAS: 10 cm/s
Right CCA prox sys: -83 cm/s
Right cca dist sys: -59 cm/s

## 2016-06-12 LAB — ECHOCARDIOGRAM COMPLETE
HEIGHTINCHES: 61 in
Weight: 3439.18 oz

## 2016-06-12 LAB — GLUCOSE, CAPILLARY
Glucose-Capillary: 62 mg/dL — ABNORMAL LOW (ref 65–99)
Glucose-Capillary: 108 mg/dL — ABNORMAL HIGH (ref 65–99)
Glucose-Capillary: 141 mg/dL — ABNORMAL HIGH (ref 65–99)

## 2016-06-12 LAB — HEMOGLOBIN A1C
Hgb A1c MFr Bld: 6.4 % — ABNORMAL HIGH (ref 4.8–5.6)
Mean Plasma Glucose: 137 mg/dL

## 2016-06-12 MED ORDER — ASPIRIN 81 MG PO TBEC: 81.0000 mg | DELAYED_RELEASE_TABLET | Freq: Every day | ORAL | 2 refills | Status: DC

## 2016-06-12 MED ORDER — ATORVASTATIN CALCIUM 40 MG PO TABS: 40.0000 mg | ORAL_TABLET | Freq: Every day | ORAL | 2 refills | Status: DC

## 2016-06-12 NOTE — Care Management Note (Signed)
Case Management Note  Patient Details  Name: Melissa Rosario MRN: [id number] Date of Birth: 04/10/1960  Subjective/Objective:  Pt is a 56 y.o. female admitted to ED on 06/10/16 with L retroorbital pain, L facial numbness, LUE weakness, and slurred speech. CT 3/18 shows no acute findings; MRI 3/19 negative as well.  PTA, pt independent of ADLS; lives with spouse.                 Action/Plan: CM consult for outpatient PT/OT.  Pt states [place], to help care for her grandchildren while her son is deployed.  She plans to be there for a couple of months.  She understands that her physician advises against this, but states she is feeling so much better, she wants to go on with her plans.  Unable to arrange OP PT/OT at this time, as pt will be out of state.  Notified Cristopher EstimableS. Biby, NP; she states to advise pt to follow up with PCP or neurologist in [place] and have them order OP PT/OT, if she desires.    Expected Discharge Date:  06/12/16               Expected Discharge Plan:  Home/Self Care  In-House Referral:     Discharge planning Services  CM Consult  Post Acute Care Choice:    Choice offered to:     DME Arranged:    DME Agency:     HH Arranged:    HH Agency:     Status of Service:  Completed, signed off  If discussed at [employer] Length of Stay Meetings, dates discussed:    Additional Comments:  Quintella BatonJulie W. Marrisa Kimber, RN, BSN  Trauma/Neuro ICU Case Manager [phone]

## 2016-06-12 NOTE — Discharge Summary (Signed)
Stroke Discharge Summary  Patient ID: Melissa Rosario   MRN: [id number]      DOB: December 15, 1960  Date of Admission: 06/10/2016 Date of Discharge: 06/12/2016  Attending Physician:  Micki Riley, MD, Stroke MD Consultant(s):    Barnett Abu, MD (neurosurgery)  Patient's PCP:  No primary care provider on file.  DISCHARGE DIAGNOSIS:  Principal Problem:   Stroke-like episode (HCC) R brain s/p IV tPA Active Problems:   Hypertensive urgency   Hyperlipidemia   Diabetes mellitus type II, controlled (HCC)   Morbid obesity (HCC), Body mass index is 40.61 kg/m.    Hydrocephalus with operating shunt   Lip edema   Anxiety   Past Medical History:  Diagnosis Date  . Diabetes mellitus without complication (HCC)   . Headache   . Hypertension    Past Surgical History:  Procedure Laterality Date  . VENTRICULOPERITONEAL SHUNT  10/2015    Allergies as of 06/12/2016      Reactions   Nsaids       Medication List    TAKE these medications   amLODipine 10 MG tablet Commonly known as:  NORVASC Take 10 mg by mouth daily.   aspirin 81 MG EC tablet Take 1 tablet (81 mg total) by mouth daily.   atorvastatin 40 MG tablet Commonly known as:  LIPITOR Take 1 tablet (40 mg total) by mouth daily at 6 PM.   baclofen 10 MG tablet Commonly known as:  LIORESAL Take 10 mg by mouth 3 (three) times daily.   brimonidine 0.15 % ophthalmic solution Commonly known as:  ALPHAGAN Place 1 drop into both eyes 3 (three) times daily.   clobetasol ointment 0.05 % Commonly known as:  TEMOVATE Apply 1 application topically 2 (two) times daily.   cyclobenzaprine 10 MG tablet Commonly known as:  FLEXERIL Take 10 mg by mouth 3 (three) times daily as needed for muscle spasms.   diclofenac sodium 1 % Gel Commonly known as:  VOLTAREN Apply 2 g topically 4 (four) times daily as needed.   glipiZIDE 10 MG 24 hr tablet Commonly known as:  GLUCOTROL XL Take 10 mg by mouth daily with breakfast.    hydrochlorothiazide 25 MG tablet Commonly known as:  HYDRODIURIL Take 25 mg by mouth daily.   latanoprost 0.005 % ophthalmic solution Commonly known as:  XALATAN Place 1 drop into both eyes at bedtime.   lidocaine 4 % cream Commonly known as:  LMX Apply 1 application topically daily as needed.   lisinopril 20 MG tablet Commonly known as:  PRINIVIL,ZESTRIL Take 20 mg by mouth daily.   LORazepam 1 MG tablet Commonly known as:  ATIVAN Take 1 mg by mouth every 8 (eight) hours.   nystatin cream Commonly known as:  MYCOSTATIN Apply 1 application topically 2 (two) times daily as needed for dry skin.   omeprazole 40 MG capsule Commonly known as:  PRILOSEC Take 40 mg by mouth 3 (three) times daily.   pregabalin 75 MG capsule Commonly known as:  LYRICA Take 75 mg by mouth 2 (two) times daily.   topiramate 50 MG tablet Commonly known as:  TOPAMAX Take 50 mg by mouth 3 (three) times daily as needed.   XIIDRA 5 % Soln Generic drug:  Lifitegrast Apply 1 drop to eye 2 (two) times daily.       LABORATORY STUDIES CBC    Component Value Date/Time   WBC 7.2 06/10/2016 1213   RBC 4.25 06/10/2016 1213   HGB 12.2  06/10/2016 1217   HCT 36.0 06/10/2016 1217   PLT 168 06/10/2016 1213   MCV 89.4 06/10/2016 1213   MCH 29.4 06/10/2016 1213   MCHC 32.9 06/10/2016 1213   RDW 13.5 06/10/2016 1213   LYMPHSABS 3.3 06/10/2016 1213   MONOABS 0.8 06/10/2016 1213   EOSABS 0.2 06/10/2016 1213   BASOSABS 0.0 06/10/2016 1213   CMP    Component Value Date/Time   NA 140 06/10/2016 1217   K 3.9 06/10/2016 1217   CL 106 06/10/2016 1217   CO2 20 (L) 06/10/2016 1213   GLUCOSE 162 (H) 06/10/2016 1217   BUN 18 06/10/2016 1217   CREATININE 1.00 06/10/2016 1217   CALCIUM 8.8 (L) 06/10/2016 1213   PROT 6.2 (L) 06/10/2016 1213   ALBUMIN 3.6 06/10/2016 1213   AST 23 06/10/2016 1213   ALT 20 06/10/2016 1213   ALKPHOS 52 06/10/2016 1213   BILITOT 0.2 (L) 06/10/2016 1213   GFRNONAA 54 (L)  06/10/2016 1213   GFRAA >60 06/10/2016 1213   COAGS Lab Results  Component Value Date   INR 1.02 06/10/2016   Lipid Panel    Component Value Date/Time   CHOL 198 06/11/2016 0301   TRIG 70 06/11/2016 0301   HDL 57 06/11/2016 0301   CHOLHDL 3.5 06/11/2016 0301   VLDL 14 06/11/2016 0301   LDLCALC 127 (H) 06/11/2016 0301   HgbA1C  Lab Results  Component Value Date   HGBA1C 6.4 (H) 06/11/2016   Urinalysis    Component Value Date/Time   COLORURINE STRAW (A) 06/10/2016 1230   APPEARANCEUR CLEAR 06/10/2016 1230   LABSPEC 1.006 06/10/2016 1230   PHURINE 6.0 06/10/2016 1230   GLUCOSEU NEGATIVE 06/10/2016 1230   HGBUR NEGATIVE 06/10/2016 1230   BILIRUBINUR NEGATIVE 06/10/2016 1230   KETONESUR NEGATIVE 06/10/2016 1230   PROTEINUR NEGATIVE 06/10/2016 1230   NITRITE NEGATIVE 06/10/2016 1230   LEUKOCYTESUR NEGATIVE 06/10/2016 1230   Urine Drug Screen     Component Value Date/Time   LABOPIA NONE DETECTED 06/10/2016 1230   COCAINSCRNUR NONE DETECTED 06/10/2016 1230   LABBENZ NONE DETECTED 06/10/2016 1230   AMPHETMU NONE DETECTED 06/10/2016 1230   THCU NONE DETECTED 06/10/2016 1230   LABBARB NONE DETECTED 06/10/2016 1230    Alcohol Level    Component Value Date/Time   ETH <5 06/10/2016 1213     SIGNIFICANT DIAGNOSTIC STUDIES Ct Head Code Stroke W/o Cm 06/10/2016  1. No acute finding.  Right VP shunt without hydrocephalus. 2. ASPECTS is 10.   Mr Brain Wo Contrast 06/11/2016 1. No acute intracranial infarct or other process identified. 2. Right frontal approach shunt catheter in place. No hydrocephalus.   Mr Maxine GlennMra Head Wo Contrast 06/11/2016 Negative intracranial MRA. No large vessel occlusion. No high-grade or correctable stenosis.   2D Echocardiogram  - Left ventricle: The cavity size was normal. Wall thickness was increased in a pattern of moderate LVH. Systolic function was normal. The estimated ejection fraction was in the range of 60% to 65%. Wall motion was normal;  there were no regional wall motion abnormalities. Features are consistent with a pseudonormal left ventricular filling pattern, with concomitant abnormal  relaxation and increased filling pressure (grade 2 diastolic dysfunction). - Aortic valve: Mildly to moderately calcified annulus.  Carotid Doppler   There is 1-39% bilateral ICA stenosis. Vertebral artery flow is antegrade.       HISTORY OF PRESENT ILLNESS Melissa Rosario is a 56 y.o. female with a hx of VP shunt placed 2 years ago.  She  was last normal at 11am 06/10/2016 (LKW) speaking with family, then suddenly developed left retroorbital pain and left facial numbness and slurred speech.  She was rushed to the ED as a code stroke. Dr. Cena Benton did not receive a call when the patient arrived at the hospital and only found the pt after her CT had been completed.  On his assessment she had an NIHSS = 4 and no contraindications to tPA. He  asked emphatically whether her weakness was chronic and she denied this.  She also does not take any anticoagulation.  He discussed risks and benefits of treatment including the possibility of intracranial bleeding; pt agreed to be treated.  She received IV tPA 06/10/2016 at 1244. She was admitted to the neuro ICU for further evaluation and treatment.     HOSPITAL COURSE Ms. Chancy Claros is a 56 y.o. female with history of HTN, DB, morbid obesity and hydrocepahlus presenting with left retroorbital pain and left facial numbness and slurred speech. She received IV t-PA 06/10/2016 at 1244.   Stroke-like symptoms, R brain, s/p IV tPA  Resultant  L hemiparesis  Code stroke CT no acute finding. R VP shunt w/o hydrocephalus. Aspects 10  MRI  no acute infarct. R frontal shunt catheter  MRA  Unremarkable   Carotid Doppler  No significant stenosis   2D Echo  EF 60-65%. No source of embolus   LDL 127  HgbA1c 6.4  SCDs for VTE prophylaxis  Diet heart healthy/carb modified Room service appropriate? Yes; Fluid  consistency: Thin  No antithrombotic prior to admission, now on aspirin 81 mg daily. Continue aspirin at discharge  Patient advised by DR. Sethi to receive OP therapy and delay trip to [place] where she plans to take care of her daughter-in-law and grandchildren x 2-3 months.   Therapy recommendations:  OP PT, HH OT (ordered at discharge. Per case manager, pt essentially refused as she plans to leave for [place] tomorrow)  Disposition:  return home  Hypertensive Urgency  BP 180/110 in setting of neurologic symptoms  BP stable  Long-term BP goal normotensive  Hyperlipidemia  Home meds:  No statin  LDL 127 above goal  Added statin - lipitor 40 mg daily  Continue statin at discharge  Diabetes, type II  HgbA1c 6.4, goal < 7.0   Other Stroke Risk Factors  UDS negative  Morbid Obesity, Body mass index is 40.61 kg/m.  Other Active Problems  Hydrocephalus s/p VP shunt at [facility] 10/2015. Reprogrammed to of water post MRI by NS  Lower lip edema post tPA, treated with benadryl and solumedrol  Anxiety on ativan daily   DISCHARGE EXAM Blood pressure 128/86, pulse 61, temperature 97.6 F (36.4 C), temperature source Oral, resp. rate 20, height 5\' 1"  (1.549 m), weight 97.5 kg (214 lb 15.2 oz), SpO2 96 %. Pleasant middle aged caucasian lady not in distress. . Afebrile. Head is nontraumatic. Neck is supple without bruit.    Cardiac exam no murmur or gallop. Lungs are clear to auscultation. Distal pulses are well felt. Neurological Exam ;  Awake alert oriented x 3 normal speech and language. Mild left lower face asymmetry. Tongue midline. Mild left upper and lower extremity drift but variable efort. Mild diminished fine finger movements on left. Orbits right over left upper extremity. Mild left grip  And hip ,flexor weak.. Normal sensation . Normal coordination.  Discharge Diet   Diet heart healthy/carb modified Room service appropriate? Yes; Fluid consistency: Thin  liquids  DISCHARGE PLAN  Disposition:  Return home with husband  OP PT and OT  aspirin 81 mg daily for primary stroke prevention.  Ongoing risk factor control by Primary Care Physician at time of discharge  Follow-up No primary care provider on file. in 2 weeks.  Follow-up with Dr. Delia Heady, Stroke Clinic in 6 weeks, office to schedule an appointment.  45 minutes were spent preparing discharge.  Rhoderick Moody [facility] See Amion for Pager information 06/12/2016 1:33 PM   I have personally examined this patient, reviewed notes, independently viewed imaging studies, participated in medical decision making and plan of care.ROS completed by me personally and pertinent positives fully documented  I have made any additions or clarifications directly to the above note. Agree with note above.    Delia Heady, MD Medical Director [facility] Pager: [phone] 06/12/2016 7:36 PM

## 2016-06-12 NOTE — Progress Notes (Signed)
Occupational Therapy Treatment and Discharge Patient Details Name: Melissa Rosario MRN: [id number] DOB: [birth year] Today's Date: 06/12/2016    History of present illness Pt is a 56 y.o. female admitted to ED on 06/10/16 with L retroorbital pain, L facial numbness and slurred speech. CT 3/18 shows no acute findings; MRI negative as well. Pertinent PMH includes shunt placement 10/2015, obesity, DM, HTN.    OT comments  Pt is able to perform ADLs mod I.  She demonstrates mild Lt UE weakness and is independent with HEP provided yesterday.  She was provided with theraputty exercises for LT hand.  SHe has met all goals.  No further OT needs indicated.  Will sign off - pt agrees.   Follow Up Recommendations  No OT follow up    Equipment Recommendations  None recommended by OT    Recommendations for Other Services      Precautions / Restrictions Precautions Precautions: None Restrictions Weight Bearing Restrictions: No       Mobility Bed Mobility               General bed mobility comments: Pt received sitting in bed  Transfers Overall transfer level: Independent Equipment used: None Transfers: Sit to/from Stand Sit to Stand: Independent              Balance Overall balance assessment: History of Falls Sitting-balance support: No upper extremity supported;Feet supported Sitting balance-Leahy Scale: Good     Standing balance support: No upper extremity supported;During functional activity Standing balance-Leahy Scale: Good               High level balance activites: Direction changes;Turns;Sudden stops;Head turns High Level Balance Comments: Able to bend down to pick item off floor with no LOB. Increased lateral sway with head turns, with 1x self-corrected LOB.    ADL Overall ADL's : Modified independent                                       General ADL Comments: Pt simulated shower in standing without LOB.       Vision                      Perception     Praxis      Cognition   Behavior During Therapy: [facility] for tasks assessed/performed Overall Cognitive Status: Within Functional Limits for tasks assessed                  General Comments: Pt reports her "thinking speed" seems much better today and similar to baseline.       Exercises Other Exercises Other Exercises: Pt demonstrates Lt UE stregnth 4+/5.  and grip 3+/5.  She was provided with red theraputty and instructed in HEP - returned demonstration    Shoulder Instructions       General Comments      Pertinent Vitals/ Pain       Pain Assessment: No/denies pain Faces Pain Scale: Hurts a little bit Pain Location: Lt hand  Pain Descriptors / Indicators: [place]                                          Prior Functioning/Environment              Frequency  Progress Toward Goals  OT Goals(current goals can now be found in the care plan section)  Progress towards OT goals: Goals met/education completed, patient discharged from [facility] All goals met and education completed, patient discharged from OT services;Discharge plan needs to be updated    Co-evaluation                 End of Session    OT Visit Diagnosis: Unsteadiness on feet (R26.81);History of falling (Z91.81);Other symptoms and signs involving the nervous system (R29.898)   Activity Tolerance Patient tolerated treatment well   Patient Left in chair;with call bell/phone within reach;with family/visitor present   Nurse Communication          Time: 9758-8325 OT Time Calculation (min): 10 min  Charges: OT General Charges $OT Visit: 1 Procedure OT Treatments $Therapeutic Exercise: 8-22 mins  [facility], OTR/L [phone]    [name] 06/12/2016, 3:00 PM

## 2016-06-12 NOTE — Progress Notes (Signed)
Physical Therapy Treatment Patient Details Name: Melissa Rosario MRN: [id number] DOB: 1960-10-25 Today's Date: 06/12/2016    History of Present Illness Pt is a 56 y.o. female admitted to ED on 06/10/16 with L retroorbital pain, L facial numbness and slurred speech. CT 3/18 shows no acute findings; MRI negative as well. Pertinent PMH includes shunt placement 10/2015, obesity, DM, HTN.     PT Comments    Pt demonstrates great improvements with mobility. Able to amb in hallway, perform higher level balance tasks, and ascend descend stairs indep; pt reports she is moving at her baseline function. DGI 21/24 indicates decreased fall risk. Educ on fall risk reduction, stroke symptoms, and importance of mobility. Pt indep with functional mobility and is ambulating at baseline. D/c recs updated to no PT needs. D/C from PT.    Follow Up Recommendations  No PT follow up     Equipment Recommendations  None recommended by PT    Recommendations for Other Services       Precautions / Restrictions Precautions Precautions: None Restrictions Weight Bearing Restrictions: No    Mobility  Bed Mobility               General bed mobility comments: Pt received sitting in bed  Transfers Overall transfer level: Independent Equipment used: None Transfers: Sit to/from Stand Sit to Stand: Independent            Ambulation/Gait Ambulation/Gait assistance: Independent Ambulation Distance (Feet): 350 Feet Assistive device: None Gait Pattern/deviations: WFL(Within Functional Limits) Gait velocity: Decreased   General Gait Details: Amb much improved today. Intermittent veering towards R-side, but self-corrects without cues.    Stairs Stairs: Yes   Stair Management: One rail Right Number of Stairs: 5 General stair comments: Able to descend stairs with no rail; descend with use of R rail, which pt states she does at baseline secondary to R knee pain.   Wheelchair Mobility    Modified  Rankin (Stroke Patients Only) Modified Rankin (Stroke Patients Only) Pre-Morbid Rankin Score: No significant disability Modified Rankin: Slight disability     Balance Overall balance assessment: History of Falls Sitting-balance support: No upper extremity supported;Feet supported Sitting balance-Leahy Scale: Good     Standing balance support: No upper extremity supported;During functional activity Standing balance-Leahy Scale: Good               High level balance activites: Direction changes;Turns;Sudden stops;Head turns High Level Balance Comments: Able to bend down to pick item off floor with no LOB. Increased lateral sway with head turns, with 1x self-corrected LOB.     Cognition Arousal/Alertness: Awake/alert Behavior During Therapy: WFL for tasks assessed/performed Overall Cognitive Status: Within Functional Limits for tasks assessed                 General Comments: Pt reports her "thinking speed" seems much better today and similar to baseline.     Exercises      General Comments        Pertinent Vitals/Pain Pain Assessment: No/denies pain    Home Living                      Prior Function            PT Goals (current goals can now be found in the care plan section) Acute Rehab PT Goals Time For Goal Achievement: 06/26/16 Progress towards PT goals: Goals met/education completed, patient discharged from PT    Frequency  PT Plan Discharge plan needs to be updated    Co-evaluation             End of Session Equipment Utilized During Treatment: Gait belt Activity Tolerance: Patient tolerated treatment well Patient left: in chair;with family/visitor present;with call bell/phone within reach Nurse Communication: Mobility status PT Visit Diagnosis: Unsteadiness on feet (R26.81);Other abnormalities of gait and mobility (R26.89)     Time: 2929-0903 PT Time Calculation (min) (ACUTE ONLY): 19 min  Charges:  $Gait  Training: 8-22 mins                    G Codes:      Melissa Rosario, Melissa Rosario Office-[phone]  Melissa Rosario 06/12/2016, 2:44 PM

## 2016-06-12 NOTE — Progress Notes (Signed)
  Echocardiogram 2D Echocardiogram has been performed.  Janalyn HarderWest, Rhandi R 06/12/2016, 11:14 AM

## 2016-06-12 NOTE — Progress Notes (Signed)
Patient discharged to home per MD order. A&O. AVS reviewed and discussed with patient and husband. Both verbalized understanding of discharge instructions. Patient ambulated in hall with PT and worked with OT prior to discharge without complications. Patient dressed self and ambulated from chair to wheelchair without any difficulty. Patient wheeled to discharge area by Myriam JacobsonHelen, CNA.

## 2016-06-12 NOTE — Progress Notes (Signed)
   06/12/16 1310  Clinical Encounter Type  Visited With Patient and family together  Visit Type Follow-up  Spiritual Encounters  Spiritual Needs Emotional  Stress Factors  Patient Stress Factors Health changes  Family Stress Factors Family relationships  Introduction to Pt and family. Gave copy and explained AD procedure. Pt to be released today and will get notarized at bank.

## 2016-09-24 ENCOUNTER — Other Ambulatory Visit: Payer: Self-pay

## 2016-09-25 ENCOUNTER — Other Ambulatory Visit: Payer: Self-pay

## 2016-09-25 NOTE — Patient Outreach (Signed)
3 outreach attempts were completed to obtain mRs for patient.  mRs could not be obtained because messages left for the patient requesting a return phone call but no return call received. mRs = 7.

## 2016-10-10 ENCOUNTER — Telehealth: Payer: Self-pay

## 2016-10-10 ENCOUNTER — Encounter: Payer: Self-pay | Admitting: Adult Health

## 2016-10-10 ENCOUNTER — Ambulatory Visit (INDEPENDENT_AMBULATORY_CARE_PROVIDER_SITE_OTHER): Payer: Medicare Other | Admitting: Adult Health

## 2016-10-10 VITALS — BP 118/73 | HR 60 | Ht 61.0 in | Wt 202.0 lb

## 2016-10-10 DIAGNOSIS — I639 Cerebral infarction, unspecified: Secondary | ICD-10-CM

## 2016-10-10 DIAGNOSIS — E785 Hyperlipidemia, unspecified: Secondary | ICD-10-CM

## 2016-10-10 DIAGNOSIS — E119 Type 2 diabetes mellitus without complications: Secondary | ICD-10-CM | POA: Diagnosis not present

## 2016-10-10 DIAGNOSIS — R2990 Unspecified symptoms and signs involving the nervous system: Secondary | ICD-10-CM

## 2016-10-10 DIAGNOSIS — I10 Essential (primary) hypertension: Secondary | ICD-10-CM | POA: Diagnosis not present

## 2016-10-10 LAB — POCT GLYCOSYLATED HEMOGLOBIN (HGB A1C): HEMOGLOBIN A1C: 6.2

## 2016-10-10 MED ORDER — AMLODIPINE BESYLATE 10 MG PO TABS: 10.0000 mg | ORAL_TABLET | Freq: Every day | ORAL | 1 refills | Status: DC

## 2016-10-10 MED ORDER — LISINOPRIL 20 MG PO TABS: 20.0000 mg | ORAL_TABLET | Freq: Every day | ORAL | 1 refills | Status: DC

## 2016-10-10 MED ORDER — GLIPIZIDE 5 MG PO TABS: 5.0000 mg | ORAL_TABLET | Freq: Two times a day (BID) | ORAL | 3 refills | Status: DC

## 2016-10-10 MED ORDER — PITAVASTATIN CALCIUM 2 MG PO TABS: 2.0000 mg | ORAL_TABLET | Freq: Every day | ORAL | 3 refills | Status: DC

## 2016-10-10 MED ORDER — HYDROCHLOROTHIAZIDE 25 MG PO TABS: 25.0000 mg | ORAL_TABLET | Freq: Every day | ORAL | 1 refills | Status: DC

## 2016-10-10 NOTE — Assessment & Plan Note (Signed)
A1c 6.2 today and occasional low blood sugar readings at home, therefore reduced Glipizide to 5mg  twice daily (previously 10mg  twice daily).  Hypoglycemia readings 70-80s several times weekly, mostly fasting check in am. Continue to check blood sugar and record. Continue daily walking and diabetic diet.

## 2016-10-10 NOTE — Assessment & Plan Note (Signed)
BP at goal today - 118/73 Continue Lisinopril 20mg  daily, HCTZ 25mg  daily, Amlodipine 10mg  daily Denies cardiac sx's. Last ECHO March 2018:  2D EchoEF 60-65%. No source of embolus

## 2016-10-10 NOTE — Assessment & Plan Note (Addendum)
Instructed to f/u with Dr. Pearlean BrownieSethi March 2018-did not. New referral placed for Dr. Montey HoraSethi-PLEASE KEEP APPT. Has minimal L sided weakness, with some confusion, however is able to provide pretty reliable/understandable HPI.

## 2016-10-10 NOTE — Patient Instructions (Addendum)
Carbohydrate Counting for Diabetes Mellitus, Adult Carbohydrate counting is a method for keeping track of how many carbohydrates you eat. Eating carbohydrates naturally increases the amount of sugar (glucose) in the blood. Counting how many carbohydrates you eat helps keep your blood glucose within normal limits, which helps you manage your diabetes (diabetes mellitus). It is important to know how many carbohydrates you can safely have in each meal. This is different for every person. A diet and nutrition specialist (registered dietitian) can help you make a meal plan and calculate how many carbohydrates you should have at each meal and snack. Carbohydrates are found in the following foods:  Grains, such as breads and cereals.  Dried beans and soy products.  Starchy vegetables, such as potatoes, peas, and corn.  Fruit and fruit juices.  Milk and yogurt.  Sweets and snack foods, such as cake, cookies, candy, chips, and soft drinks.  How do I count carbohydrates? There are two ways to count carbohydrates in food. You can use either of the methods or a combination of both. Reading "Nutrition Facts" on packaged food The "Nutrition Facts" list is included on the labels of almost all packaged foods and beverages in the [place] It includes:  The serving size.  Information about nutrients in each serving, including the grams (g) of carbohydrate per serving.  To use the "Nutrition Facts":  Decide how many servings you will have.  Multiply the number of servings by the number of carbohydrates per serving.  The resulting number is the total amount of carbohydrates that you will be having.  Learning standard serving sizes of other foods When you eat foods containing carbohydrates that are not packaged or do not include "Nutrition Facts" on the label, you need to measure the servings in order to count the amount of carbohydrates:  Measure the foods that you will eat with a food scale or  measuring cup, if needed.  Decide how many standard-size servings you will eat.  Multiply the number of servings by 15. Most carbohydrate-rich foods have about 15 g of carbohydrates per serving. ? For example, if you eat 8 oz (170 g) of strawberries, you will have eaten 2 servings and 30 g of carbohydrates (2 servings x 15 g = 30 g).  For foods that have more than one food mixed, such as soups and casseroles, you must count the carbohydrates in each food that is included.  The following list contains standard serving sizes of common carbohydrate-rich foods. Each of these servings has about 15 g of carbohydrates:   hamburger bun or  English muffin.   oz (15 mL) syrup.   oz (14 g) jelly.  1 slice of bread.  1 six-inch tortilla.  3 oz (85 g) cooked rice or pasta.  4 oz (113 g) cooked dried beans.  4 oz (113 g) starchy vegetable, such as peas, corn, or potatoes.  4 oz (113 g) hot cereal.  4 oz (113 g) mashed potatoes or  of a large baked potato.  4 oz (113 g) canned or frozen fruit.  4 oz (120 mL) fruit juice.  4-6 crackers.  6 chicken nuggets.  6 oz (170 g) unsweetened dry cereal.  6 oz (170 g) plain fat-free yogurt or yogurt sweetened with artificial sweeteners.  8 oz (240 mL) milk.  8 oz (170 g) fresh fruit or one small piece of fruit.  24 oz (680 g) popped popcorn.  Example of carbohydrate counting Sample meal  3 oz (85 g) chicken breast.    6 oz (170 g) brown rice.  4 oz (113 g) corn.  8 oz (240 mL) milk.  8 oz (170 g) strawberries with sugar-free whipped topping. Carbohydrate calculation 1. Identify the foods that contain carbohydrates: ? Rice. ? Corn. ? Milk. ? Strawberries. 2. Calculate how many servings you have of each food: ? 2 servings rice. ? 1 serving corn. ? 1 serving milk. ? 1 serving strawberries. 3. Multiply each number of servings by 15 g: ? 2 servings rice x 15 g = 30 g. ? 1 serving corn x 15 g = 15 g. ? 1 serving milk x 15  g = 15 g. ? 1 serving strawberries x 15 g = 15 g. 4. Add together all of the amounts to find the total grams of carbohydrates eaten: ? 30 g + 15 g + 15 g + 15 g = 75 g of carbohydrates total. This information is not intended to replace advice given to you by your health care provider. Make sure you discuss any questions you have with your health care provider. Document Released: 03/12/2005 Document Revised: 09/30/2015 Document Reviewed: 08/24/2015 Elsevier Interactive Patient Education  2018 [employer].   Hypertension Hypertension, commonly called high blood pressure, is when the force of blood pumping through the arteries is too strong. The arteries are the blood vessels that carry blood from the heart throughout the body. Hypertension forces the heart to work harder to pump blood and may cause arteries to become narrow or stiff. Having untreated or uncontrolled hypertension can cause heart attacks, strokes, kidney disease, and other problems. A blood pressure reading consists of a higher number over a lower number. Ideally, your blood pressure should be below 120/80. The first ("top") number is called the systolic pressure. It is a measure of the pressure in your arteries as your heart beats. The second ("bottom") number is called the diastolic pressure. It is a measure of the pressure in your arteries as the heart relaxes. What are the causes? The cause of this condition is not known. What increases the risk? Some risk factors for high blood pressure are under your control. Others are not. Factors you can change  Smoking.  Having type 2 diabetes mellitus, high cholesterol, or both.  Not getting enough exercise or physical activity.  Being overweight.  Having too much fat, sugar, calories, or salt (sodium) in your diet.  Drinking too much alcohol. Factors that are difficult or impossible to change  Having chronic kidney disease.  Having a family history of high blood  pressure.  Age. Risk increases with age.  Race. You may be at higher risk if you are African-American.  Gender. Men are at higher risk than women before age 43. After age 36, women are at higher risk than men.  Having obstructive sleep apnea.  Stress. What are the signs or symptoms? Extremely high blood pressure (hypertensive crisis) may cause:  Headache.  Anxiety.  Shortness of breath.  Nosebleed.  Nausea and vomiting.  Severe chest pain.  Jerky movements you cannot control (seizures).  How is this diagnosed? This condition is diagnosed by measuring your blood pressure while you are seated, with your arm resting on a surface. The cuff of the blood pressure monitor will be placed directly against the skin of your upper arm at the level of your heart. It should be measured at least twice using the same arm. Certain conditions can cause a difference in blood pressure between your right and left arms. Certain factors can  cause blood pressure readings to be lower or higher than normal (elevated) for a short period of time:  When your blood pressure is higher when you are in a health care provider's office than when you are at home, this is called white coat hypertension. Most people with this condition do not need medicines.  When your blood pressure is higher at home than when you are in a health care provider's office, this is called masked hypertension. Most people with this condition may need medicines to control blood pressure.  If you have a high blood pressure reading during one visit or you have normal blood pressure with other risk factors:  You may be asked to return on a different day to have your blood pressure checked again.  You may be asked to monitor your blood pressure at home for 1 week or longer.  If you are diagnosed with hypertension, you may have other blood or imaging tests to help your health care provider understand your overall risk for other  conditions. How is this treated? This condition is treated by making healthy lifestyle changes, such as eating healthy foods, exercising more, and reducing your alcohol intake. Your health care provider may prescribe medicine if lifestyle changes are not enough to get your blood pressure under control, and if:  Your systolic blood pressure is above 130.  Your diastolic blood pressure is above 80.  Your personal target blood pressure may vary depending on your medical conditions, your age, and other factors. Follow these instructions at home: Eating and drinking  Eat a diet that is high in fiber and potassium, and low in sodium, added sugar, and fat. An example eating plan is called the DASH (Dietary Approaches to Stop Hypertension) diet. To eat this way: ? Eat plenty of fresh fruits and vegetables. Try to fill half of your plate at each meal with fruits and vegetables. ? Eat whole grains, such as whole wheat pasta, brown rice, or whole grain bread. Fill about one quarter of your plate with whole grains. ? Eat or drink low-fat dairy products, such as skim milk or low-fat yogurt. ? Avoid fatty cuts of meat, processed or cured meats, and poultry with skin. Fill about one quarter of your plate with lean proteins, such as fish, chicken without skin, beans, eggs, and tofu. ? Avoid premade and processed foods. These tend to be higher in sodium, added sugar, and fat.  Reduce your daily sodium intake. Most people with hypertension should eat less than 1,500 mg of sodium a day.  Limit alcohol intake to no more than 1 drink a day for nonpregnant women and 2 drinks a day for men. One drink equals 12 oz of beer, 5 oz of wine, or 1 oz of hard liquor. Lifestyle  Work with your health care provider to maintain a healthy body weight or to lose weight. Ask what an ideal weight is for you.  Get at least 30 minutes of exercise that causes your heart to beat faster (aerobic exercise) most days of the week.  Activities may include walking, swimming, or biking.  Include exercise to strengthen your muscles (resistance exercise), such as pilates or lifting weights, as part of your weekly exercise routine. Try to do these types of exercises for 30 minutes at least 3 days a week.  Do not use any products that contain nicotine or tobacco, such as cigarettes and e-cigarettes. If you need help quitting, ask your health care provider.  Monitor your blood pressure at  home as told by your health care provider.  Keep all follow-up visits as told by your health care provider. This is important. Medicines  Take over-the-counter and prescription medicines only as told by your health care provider. Follow directions carefully. Blood pressure medicines must be taken as prescribed.  Do not skip doses of blood pressure medicine. Doing this puts you at risk for problems and can make the medicine less effective.  Ask your health care provider about side effects or reactions to medicines that you should watch for. Contact a health care provider if:  You think you are having a reaction to a medicine you are taking.  You have headaches that keep coming back (recurring).  You feel dizzy.  You have swelling in your ankles.  You have trouble with your vision. Get help right away if:  You develop a severe headache or confusion.  You have unusual weakness or numbness.  You feel faint.  You have severe pain in your chest or abdomen.  You vomit repeatedly.  You have trouble breathing. Summary  Hypertension is when the force of blood pumping through your arteries is too strong. If this condition is not controlled, it may put you at risk for serious complications.  Your personal target blood pressure may vary depending on your medical conditions, your age, and other factors. For most people, a normal blood pressure is less than 120/80.  Hypertension is treated with lifestyle changes, medicines, or a  combination of both. Lifestyle changes include weight loss, eating a healthy, low-sodium diet, exercising more, and limiting alcohol. This information is not intended to replace advice given to you by your health care provider. Make sure you discuss any questions you have with your health care provider. Document Released: 03/12/2005 Document Revised: 02/08/2016 Document Reviewed: 02/08/2016 Elsevier Interactive Patient Education  2018 [employer]  A1c 6.2 and occasional low blood sugar readings at home, therefore reduced Glipizide to 5mg  twice daily (not the previous 10mg  twice daily).   Refilled blood pressure meds. Continue to check blood sugar and record. Continue daily walking and diabetic diet. Continue all other medications as directed. Neurology referral placed-Dr. Montey HoraSethi-PLEASE MAKE APPT ASAP! Follow-up in 3 months, sooner if needed. WELCOME TO THE PRACTICE!

## 2016-10-10 NOTE — Assessment & Plan Note (Signed)
06/11/16:  Ref Range & Units 775mo ago  Cholesterol 0 - 200 mg/dL 161198   Triglycerides <096<150 mg/dL 70   HDL >04>40 mg/dL 57   Total CHOL/HDL Ratio RATIO 3.5   VLDL 0 - 40 mg/dL 14   LDL Cholesterol 0 - 99 mg/dL 540127    Instructed to take Atorvastatin 40mg  nightly at 1800, however she states that hip pain develops with Lipitor. Instead she is taking Pitavastatin 1075m daily. She was not fasting today, will obtain lipid panel at next OV. Continue daily walking and avoiding saturated fat in diet.

## 2016-10-10 NOTE — Progress Notes (Signed)
Subjective:    Patient ID: Melissa Rosario, female    DOB: 04-Feb-1961, 56 y.o.   MRN: [id number]  HPI:  Melissa Rosario presents to establish as a new pt.  She is a very pleasant 56 year old female.  PMH:  HTN, HL, T2D, Obesity, anxiety,fibromyalgia, Hydrocephalus with patent shunt (initially placed Aug 2016 with 2 revisions in 2017), and most recently stroke-like episode R brain March 2018 taht required several days in ICU(MRI, MRA negative).  Melissa Rosario was instructed to follow-up with Dr. [facility], however she did not.  Since then she has L sided weakness, fatigue and minor confusion.  She has had intermittent medical care throughout the years due to lack of health coverage.  She reports walking> 1 miles/day, follows a diabetic diet, and denies tobacco/EOTH use.  Significant family hx of HTN/MI, Diabetes, and substance abuse. She is married and lives with her retired husband.     Patient Care Team    Relationship Specialty Notifications Start End  William Hamburger D, NP PCP - General Family Medicine  10/10/16   Levy Pupa, MD Referring Physician Ophthalmology  10/10/16   Thea Gist., MD Referring Physician Cardiology  10/10/16   Prescott Parma, MD Physician Assistant Neurosurgery  10/10/16     Patient Active Problem List   Diagnosis Date Noted  . Hypertensive urgency 06/12/2016  . Hyperlipidemia 06/12/2016  . Diabetes mellitus type II, controlled (HCC) 06/12/2016  . Morbid obesity (HCC) 06/12/2016  . Hydrocephalus with operating shunt 06/12/2016  . Lip edema 06/12/2016  . Anxiety 06/12/2016  . Stroke-like episode (HCC) R brain s/p IV tPA 06/10/2016  . S/P VP shunt 03/22/2015  . Abnormal ultrasound of abdomen 03/15/2015  . IIH (idiopathic intracranial hypertension) 11/05/2014  . Other abnormal glucose 12/14/2009     Past Medical History:  Diagnosis Date  . Diabetes mellitus without complication (HCC)   . Headache   . Hyperlipidemia   . Hypertension      Past Surgical  History:  Procedure Laterality Date  . APPENDECTOMY  1982  . CHOLECYSTECTOMY  1982  . SHUNT REPLACEMENT  2016  . SHUNT REVISION  2018  . STAPEDES SURGERY Bilateral 2007  . TONSILLECTOMY  1974  . VENTRICULOPERITONEAL SHUNT  10/2015     Family History  Problem Relation Age of Onset  . Hypertension Mother   . Alzheimer's disease Mother   . Lung cancer Father   . Hypertension Father   . Hypertension Sister   . Alcoholism Brother   . Hypertension Brother   . Diabetes Brother   . Heart attack Maternal Grandfather   . Heart attack Paternal Grandfather   . Hypertension Sister      History  Drug Use No     History  Alcohol Use No     History  Smoking Status  . Never Smoker  Smokeless Tobacco  . Never Used     Outpatient Encounter Prescriptions as of 10/10/2016  Medication Sig  . amLODipine (NORVASC) 10 MG tablet Take 1 tablet (10 mg total) by mouth daily.  . baclofen (LIORESAL) 10 MG tablet Take 10 mg by mouth 3 (three) times daily.  Marland Kitchen BAYER MICROLET LANCETS lancets by Other route daily. Use as instructed  . brimonidine (ALPHAGAN) 0.15 % ophthalmic solution Place 1 drop into both eyes 3 (three) times daily.  . clobetasol ointment (TEMOVATE) 0.05 % Apply 1 application topically 2 (two) times daily.  . cyclobenzaprine (FLEXERIL) 10 MG tablet Take 10 mg by mouth 3 (  three) times daily as needed for muscle spasms.  . fluconazole (DIFLUCAN) 150 MG tablet Take 150 mg by mouth as needed.  Marland Kitchen. glucose blood (BAYER CONTOUR TEST) test strip 1 each by Other route daily. Use as instructed  . hydrochlorothiazide (HYDRODIURIL) 25 MG tablet Take 1 tablet (25 mg total) by mouth daily.  Marland Kitchen. ketoconazole (NIZORAL) 2 % cream Apply topically.  . latanoprost (XALATAN) 0.005 % ophthalmic solution Place 1 drop into both eyes at bedtime.  . lidocaine (LMX) 4 % cream Apply 1 application topically daily as needed.  Marland Kitchen. Lifitegrast (XIIDRA) 5 % SOLN Apply 1 drop to eye 2 (two) times daily.  Marland Kitchen.  lisinopril (PRINIVIL,ZESTRIL) 20 MG tablet Take 1 tablet (20 mg total) by mouth daily.  Marland Kitchen. LORazepam (ATIVAN) 1 MG tablet Take 1 mg by mouth 2 (two) times daily.   Marland Kitchen. nystatin cream (MYCOSTATIN) Apply 1 application topically 2 (two) times daily as needed for dry skin.  Marland Kitchen. omeprazole (PRILOSEC) 40 MG capsule Take 40 mg by mouth 2 (two) times daily.   . Pitavastatin Calcium (LIVALO) 2 MG TABS Take 1 tablet (2 mg total) by mouth daily.  . pregabalin (LYRICA) 75 MG capsule Take 75 mg by mouth 2 (two) times daily.  Marland Kitchen. topiramate (TOPAMAX) 50 MG tablet Take 50 mg by mouth 3 (three) times daily as needed.  . [DISCONTINUED] amLODipine (NORVASC) 10 MG tablet Take 10 mg by mouth daily.  . [DISCONTINUED] aspirin EC 81 MG EC tablet Take 1 tablet (81 mg total) by mouth daily.  . [DISCONTINUED] glipiZIDE (GLUCOTROL XL) 10 MG 24 hr tablet Take 5 mg by mouth 2 (two) times daily.  . [DISCONTINUED] hydrochlorothiazide (HYDRODIURIL) 25 MG tablet Take 25 mg by mouth daily.  . [DISCONTINUED] lisinopril (PRINIVIL,ZESTRIL) 20 MG tablet Take 20 mg by mouth daily.  . [DISCONTINUED] Pitavastatin Calcium (LIVALO) 2 MG TABS Take 2 mg by mouth daily.  Marland Kitchen. glipiZIDE (GLUCOTROL) 5 MG tablet Take 1 tablet (5 mg total) by mouth 2 (two) times daily before a meal.  . [DISCONTINUED] atorvastatin (LIPITOR) 40 MG tablet Take 1 tablet (40 mg total) by mouth daily at 6 PM.  . [DISCONTINUED] diclofenac sodium (VOLTAREN) 1 % GEL Apply 2 g topically 4 (four) times daily as needed.   No facility-administered encounter medications on file as of 10/10/2016.     Allergies: Hydromorphone hcl; Lovastatin; and Nsaids  Body mass index is 38.17 kg/m.  Blood pressure 118/73, pulse 60, height 5\' 1"  (1.549 m), weight 202 lb (91.6 kg), last menstrual period 08/24/1985.   Review of Systems  Constitutional: Positive for fatigue. Negative for activity change, appetite change, chills, diaphoresis, fever and unexpected weight change.  Eyes: Negative  for visual disturbance.  Respiratory: Negative for cough, chest tightness, shortness of breath, wheezing and stridor.   Cardiovascular: Negative for chest pain, palpitations and leg swelling.  Gastrointestinal: Negative for abdominal distention, abdominal pain, blood in stool, constipation, diarrhea, nausea and vomiting.  Endocrine: Negative for cold intolerance, heat intolerance, polydipsia, polyphagia and polyuria.  Genitourinary: Negative for difficulty urinating, flank pain and hematuria.  Musculoskeletal: Positive for arthralgias, back pain, gait problem, joint swelling, myalgias, neck pain and neck stiffness.  Skin: Positive for wound. Negative for color change, pallor and rash.       Bottom R foot-developed 1.5 weeks ago  Allergic/Immunologic: Negative for immunocompromised state.  Neurological: Positive for weakness and headaches. Negative for dizziness, tremors, facial asymmetry, speech difficulty, light-headedness and numbness.       L side  weaker than R side since March 2018  Hematological: Does not bruise/bleed easily.  Psychiatric/Behavioral: Positive for confusion and sleep disturbance. Negative for agitation, behavioral problems, decreased concentration, dysphoric mood, hallucinations, self-injury and suicidal ideas. The patient is not nervous/anxious.        Objective:   Physical Exam  Constitutional: She is oriented to person, place, and time. She appears well-developed and well-nourished. No distress.  HENT:  Head: Normocephalic and atraumatic.  Right Ear: External ear normal. Decreased hearing is noted.  Left Ear: External ear normal. Decreased hearing is noted.  Neck: Normal range of motion. Neck supple.  Cardiovascular: Normal rate, regular rhythm, normal heart sounds and intact distal pulses.   No murmur heard. Pulmonary/Chest: Effort normal and breath sounds normal. No respiratory distress. She has no wheezes. She has no rales. She exhibits no tenderness.    Lymphadenopathy:    She has no cervical adenopathy.  Neurological: She is alert and oriented to person, place, and time. She displays no atrophy, no tremor and normal reflexes. She exhibits normal muscle tone. She displays no seizure activity. Coordination and gait normal.  L side slightly weaker with hand grip strength and LLE active ROM  Skin: Skin is warm and dry. No rash noted. She is not diaphoretic. No erythema. No pallor.     Very superficial round area of sloughed off skin.  No open tissue/drainage/excessive warmth/streaking noted.  Psychiatric: She has a normal mood and affect. Her speech is normal and behavior is normal. Judgment and thought content normal. Cognition and memory are normal.  Nursing note and vitals reviewed.         Assessment & Plan:   1. Diabetes mellitus without complication (HCC)   2. Cerebrovascular accident (CVA), unspecified mechanism (HCC)   3. Stroke-like episode (HCC) R brain s/p IV tPA   4. Controlled type 2 diabetes mellitus without complication, without long-term current use of insulin (HCC)   5. Hyperlipidemia, unspecified hyperlipidemia type   6. HTN, goal below 140/90     Stroke-like episode (HCC) R brain s/p IV tPA Instructed to f/u with Dr. Pearlean Brownie March 2018-did not. New referral placed for Dr. Montey Hora [facility]. Has minimal L sided weakness, with some memory impairment.   Diabetes mellitus type II, controlled (HCC) A1c 6.2 today and occasional low blood sugar readings at home, therefore reduced Glipizide to 5mg  twice daily (previously 10mg  twice daily).  Hypoglycemia readings 70-80s several times weekly, mostly fasting check in am. Continue to check blood sugar and record. Continue daily walking and diabetic diet.  Hyperlipidemia 06/11/16:  Ref Range & Units 15mo ago  Cholesterol 0 - 200 mg/dL 161   Triglycerides <096 mg/dL 70   HDL >04 mg/dL 57   Total CHOL/HDL Ratio RATIO 3.5   VLDL 0 - 40 mg/dL 14   LDL Cholesterol 0 -  99 mg/dL 540    Instructed to take Atorvastatin 40mg  nightly at 1800, however she states that hip pain develops with Lipitor. Instead she is taking Pitavastatin 27m daily. She was not fasting today, will obtain lipid panel at next OV. Continue daily walking and avoiding saturated fat in diet.   HTN, goal below 140/90 BP at goal today - 118/73 Continue Lisinopril 20mg  daily, HCTZ 25mg  daily, Amlodipine 10mg  daily Denies cardiac sx's. Last ECHO March 2018:  2D EchoEF 60-65%. No source of embolus     FOLLOW-UP:  Return in about 3 months (around 01/10/2017) for Regular Follow Up, HTN, Diabetes.

## 2016-10-10 NOTE — Telephone Encounter (Signed)
Spoke with patient regarding her medications.  She says that she cannot take lipitor due to hip pain.  She is taking livalo for her cholesterol at this time.  Patient refused lipitor.

## 2016-10-17 ENCOUNTER — Telehealth: Payer: Self-pay | Admitting: Adult Health

## 2016-10-17 NOTE — Telephone Encounter (Signed)
Patient called stating Melissa Rosario changed her blood sugar med regiment and the last 24 hours she has had large fluctuations in her blood sugar level. She wants to speak with someone about this, she can be reached on her husband cell [phone])

## 2016-10-17 NOTE — Telephone Encounter (Addendum)
Spoke with patient regarding her blood sugar.  Her glucose was 97 at 9pm, patient had a hamburger for lunch, salad and crystal light for dinner.  Her mornings reading was 204, then 115 after breakfast.  Patient admits she did not take her medication before dinner she took it after.  Advised patient to just continue to monitor glucose.  Also recommended she track her food intake to better understand why she may have spikes in her level.  Patient understands and will continue to monitor.  She also wanted to let us know she has a neurology appointment scheduled.  If patient has any other glucose trouble she is to call and schedule an appointment.  She understands this.

## 2016-10-23 ENCOUNTER — Encounter: Payer: Self-pay | Admitting: Neurology

## 2016-10-23 ENCOUNTER — Ambulatory Visit (INDEPENDENT_AMBULATORY_CARE_PROVIDER_SITE_OTHER): Payer: Self-pay | Admitting: Neurology

## 2016-10-23 VITALS — BP 113/69 | HR 73 | Ht 61.0 in | Wt 208.0 lb

## 2016-10-23 DIAGNOSIS — R2990 Unspecified symptoms and signs involving the nervous system: Secondary | ICD-10-CM

## 2016-10-23 DIAGNOSIS — I639 Cerebral infarction, unspecified: Secondary | ICD-10-CM

## 2016-10-23 MED ORDER — TOPIRAMATE 50 MG PO TABS: 50.0000 mg | ORAL_TABLET | Freq: Three times a day (TID) | ORAL | 3 refills | Status: DC | PRN

## 2016-10-23 MED ORDER — BACLOFEN 10 MG PO TABS: 10.0000 mg | ORAL_TABLET | Freq: Three times a day (TID) | ORAL | 3 refills | Status: DC

## 2016-10-23 NOTE — Patient Instructions (Addendum)
I had a long discussion with the patient and husband regarding her strokelike episode and recommend she continue aspirin for stroke prevention and maintain strict control of hypertension with blood pressure goal below 130/90, lipids with LDL cholesterol goal below 70 mg percent and diabetes with hemoglobin A1c goal below 6.5%. I also recommended men she eat a healthy diet with lots of fruits, vegetables, whole gr and cereals and also participated in continue his physical activity for 30 minutes and lose some weight. She will continue on her current dosages of Topamax and baclofen as needed for her headaches which appear to be stable. I also advised her to do regular neck stretching exercises and participate in stress relaxation activities to help with the headache. She'll return for follow-up in 6 months with minus practitioner or call earlier if necessary.

## 2016-10-23 NOTE — Progress Notes (Signed)
[facility] [address] [place] [id number] [phone]       OFFICE FOLLOW-UP NOTE  Melissa. Melissa Rosario Date of Birth:  [birth year] Medical Record Number:  [id number]   HPI:  Melissa Rosario is a [age] year Caucasian lady who seen today after hospital admission in March 2018. She is accompanied by husband. History is obtained from the patient, husband, review of electronic medical records and have personally reviewed imaging films.Melissa Fermoina Adamsis a 56 y.o.femalewith a hx of VP shunt placed 2 years ago. She was last normal at 11am 06/10/2016 ([facility]) speaking with family, then suddenly developed left retroorbital pain and left facial numbness and slurred speech. She was rushed to the ED as a code stroke. Dr. Cena BentonVega did not receive a call when the patient arrived at the hospital and only found the pt after her CT had been completed. On his assessment she had an NIHSS = 4 and no contraindications to tPA. He  asked emphatically whether her weakness was chronic and she denied this. She also does not take any anticoagulation. He discussed risks and benefits of treatment including the possibility of intracranial bleeding; pt agreed to be treated. She received IV tPA 06/10/2016 at 1244. She was admitted to the neuro ICU for further evaluation and treatment.  She remained neurologically stable and made a full recovery. Blood pressure was tightly controlled. MRI scan of the brain showed no evidence of acute infarct. Right frontal ventricular peritoneal shunt catheter was noted. Ventricular size was not enlarged. MRA of the brain showed no significant intracranial and carotid ultrasound showed no significant extracranial stenosis. Transthoracic echo showed normal ejection fraction without cardiac source of embolism. LDL cholesterol was elevated 127 mg percent. Hemoglobin A1c was 6.4. Patient was started on aspirin for stroke prevention which is tolerating well without bruising or bleeding. She states  her blood pressure well controlled today it is 113/69. She had a follow-up in about an A1c checked a few weeks ago which was satisfactory at 6.2. She is tolerating her statin without muscle aches or pains. The patient has a new complaint today of chronic headaches which she feels started after her strokelike episode. She did have a remote history of migraine headaches but these gradually resolved over the years. She describes the headaches now occurring at a frequency of once per week. She does take baclofen as needed and Topamax therapy times daily which provided relief. She does see a neurologist Dr. Caswell [place]winHoward Kraft [place]. She has currently applied for disability and has no health insurance and hence would not want any further workup or changes in medications at the present time. ROS:   14 system review of systems is positive for fatigue, hearing loss, ringing in the ears, trouble swallowing, rash, itching, joint pain, cramps, aching muscles, headache, numbness, weakness, difficulty swallowing, anxiety, not enough sleep in all systems negative  PMH:  Past Medical History:  Diagnosis Date  . Diabetes mellitus without complication (HCC)   . Fibromyalgia   . Headache   . Hearing loss   . Hyperlipidemia   . Hypertension   . Stroke [facility]HCC)     Social History:  Social History   Social History  . Marital status: Married    Spouse name: N/A  . Number of children: 3  . Years of education: N/A   Occupational History  . Unemployed    Social History Main Topics  . Smoking status: Never Smoker  . Smokeless tobacco: Never Used  .  Alcohol use No  . Drug use: No  . Sexual activity: Yes    Partners: Male    Birth control/ protection: None   Other Topics Concern  . Not on file   Social History Narrative  . No narrative on file    Medications:   Current Outpatient Prescriptions on File Prior to Visit  Medication Sig Dispense Refill  . amLODipine (NORVASC) 10 MG  tablet Take 1 tablet (10 mg total) by mouth daily. 90 tablet 1  . BAYER MICROLET LANCETS lancets by Other route daily. Use as instructed    . brimonidine (ALPHAGAN) 0.15 % ophthalmic solution Place 1 drop into both eyes 3 (three) times daily.    . clobetasol ointment (TEMOVATE) 0.05 % Apply 1 application topically 2 (two) times daily.    . cyclobenzaprine (FLEXERIL) 10 MG tablet Take 10 mg by mouth 3 (three) times daily as needed for muscle spasms.    . fluconazole (DIFLUCAN) 150 MG tablet Take 150 mg by mouth as needed.    Marland Kitchen. glipiZIDE (GLUCOTROL) 5 MG tablet Take 1 tablet (5 mg total) by mouth 2 (two) times daily before a meal. 60 tablet 3  . glucose blood (BAYER CONTOUR TEST) test strip 1 each by Other route daily. Use as instructed    . hydrochlorothiazide (HYDRODIURIL) 25 MG tablet Take 1 tablet (25 mg total) by mouth daily. 90 tablet 1  . ketoconazole (NIZORAL) 2 % cream Apply topically.    . latanoprost (XALATAN) 0.005 % ophthalmic solution Place 1 drop into both eyes at bedtime.    . lidocaine (LMX) 4 % cream Apply 1 application topically daily as needed.    Marland Kitchen. Lifitegrast (XIIDRA) 5 % SOLN Apply 1 drop to eye 2 (two) times daily.    Marland Kitchen. lisinopril (PRINIVIL,ZESTRIL) 20 MG tablet Take 1 tablet (20 mg total) by mouth daily. 90 tablet 1  . LORazepam (ATIVAN) 1 MG tablet Take 1 mg by mouth 2 (two) times daily.     Marland Kitchen. nystatin cream (MYCOSTATIN) Apply 1 application topically 2 (two) times daily as needed for dry skin.    Marland Kitchen. omeprazole (PRILOSEC) 40 MG capsule Take 40 mg by mouth 2 (two) times daily.     . Pitavastatin Calcium (LIVALO) 2 MG TABS Take 1 tablet (2 mg total) by mouth daily. 90 tablet 3  . pregabalin (LYRICA) 75 MG capsule Take 75 mg by mouth as needed.      No current facility-administered medications on file prior to visit.     Allergies:   Allergies  Allergen Reactions  . Hydromorphone Hcl Nausea And Vomiting  . Lovastatin Other (See Comments)    myalgia  . Nsaids      Physical Exam General: well developed, well nourished, seated, in no evident distress Head: head normocephalic and atraumatic.  Neck: supple with no carotid or supraclavicular bruits Cardiovascular: regular rate and rhythm, no murmurs Musculoskeletal: no deformity Skin:  no rash/petichiae Vascular:  Normal pulses all extremities Vitals:   10/23/16 0917  BP: 113/69  Pulse: 73   Neurologic Exam Mental Status: Awake and fully alert. Oriented to place and time. Recent and remote memory intact. Attention span, concentration and fund of knowledge appropriate. Mood and affect appropriate.  Cranial Nerves: Fundoscopic exam reveals sharp disc margins. Pupils equal, briskly reactive to light. Extraocular movements full without nystagmus. Visual fields full to confrontation. Hearing intact. Facial sensation intact. Face, tongue, palate moves normally and symmetrically.  Motor: Normal bulk and tone. Normal strength in all  tested extremity muscles. Sensory.: intact to touch ,pinprick .position and vibratory sensation.  Coordination: Rapid alternating movements normal in all extremities. Finger-to-nose and heel-to-shin performed accurately bilaterally. Gait and Station: Arises from chair without difficulty. Stance is normal. Gait demonstrates normal stride length and balance . Able to heel, toe and tandem walk without difficulty.  Reflexes: 1+ and symmetric. Toes downgoing.   NIHSS  0 Modified Rankin 1   ASSESSMENT: 1 year lady with strokelike episode in March 2018 13 of unclear etiology treated with IV TPA. Chronic recurrent headaches likely mixed migraine and tension headaches which appear to be well controlled on the current medication regime.    PLAN: I had a long discussion with the patient and husband regarding her strokelike episode and recommend she continue aspirin for stroke prevention and maintain strict control of hypertension with blood pressure goal below 130/90, lipids with  LDL cholesterol goal below 70 mg percent and diabetes with hemoglobin A1c goal below 6.5%. I also recommended men she eat a healthy diet with lots of fruits, vegetables, whole gr and cereals and also participated in continue his physical activity for 30 minutes and lose some weight. She will continue on her current dosages of Topamax and baclofen as needed for her headaches which appear to be stable. I also advised her to do regular neck stretching exercises and participate in stress relaxation activities to help with the headache. She'll return for follow-up in 6 months with minus practitioner or call earlier if necessary.. She was advised to follow-up with neurologist Dr. Marisa Sprinkles in [facility] for her headaches.Greater than 50% of time during this 25 minute visit was spent on counseling,explanation of diagnosis of strokelike episode, migraine and mixed tension headaches, planning of further management, discussion with patient and family and coordination of care Delia Heady, MD  [facility] [address] [place], [place] [address]  Phone [phone] Fax [phone] Note: This document was prepared with digital dictation and possible smart phrase technology. Any transcriptional errors that result from this process are unintentional

## 2016-10-29 ENCOUNTER — Other Ambulatory Visit: Payer: Self-pay

## 2016-10-29 DIAGNOSIS — I639 Cerebral infarction, unspecified: Principal | ICD-10-CM

## 2016-10-29 DIAGNOSIS — R2990 Unspecified symptoms and signs involving the nervous system: Secondary | ICD-10-CM

## 2016-10-29 MED ORDER — TOPIRAMATE 50 MG PO TABS: 50.0000 mg | ORAL_TABLET | Freq: Three times a day (TID) | ORAL | 1 refills | Status: DC | PRN

## 2016-11-08 LAB — HM DIABETES EYE EXAM

## 2016-11-12 ENCOUNTER — Telehealth: Payer: Self-pay | Admitting: Adult Health

## 2016-11-12 DIAGNOSIS — E119 Type 2 diabetes mellitus without complications: Secondary | ICD-10-CM

## 2016-11-12 NOTE — Telephone Encounter (Signed)
Pt's spouse states that on Friday 11/09/16 they went to dinner and the pt took 2 bites of salad and passed out.  She had peanut butter and crackers 3 hours prior to this episode.  Blood sugars are having extreme swings.  She is having hot spells from her neck up at times and feels nausea.  Pt's spouse is requesting a referral to Dr. Elvera Lennox.  Please advise.  Tiajuana Amass, CMA

## 2016-11-12 NOTE — Telephone Encounter (Signed)
Patient's husband called and left VM stating wife has been going through dizzy/pass out spells and wants to speak with someone clinical about getting a referral

## 2016-11-12 NOTE — Telephone Encounter (Signed)
Morning,  Please put referral to Dr. Elvera Lennox Thanks! Orpha Bur

## 2016-11-12 NOTE — Addendum Note (Signed)
Addended by: Stan Head on: 11/12/2016 11:58 AM   Modules accepted: Orders

## 2016-11-13 ENCOUNTER — Telehealth: Payer: Self-pay | Admitting: Internal Medicine

## 2016-11-13 NOTE — Telephone Encounter (Signed)
This is a Dr. Reece Agar patient

## 2016-11-13 NOTE — Telephone Encounter (Signed)
Patient has not been seen here yet and no appointments with any of the MD's. I am assuming they need a new patient visit with any of the providers. Do we have the referral to get them scheduled. Thank you!

## 2016-11-13 NOTE — Telephone Encounter (Signed)
Patient's husband Greggory Stallion calling to check on status of referral.  Patient passed out Friday night and is not doing well.   Please advise or update,  -LL

## 2016-11-13 NOTE — Telephone Encounter (Signed)
Patient is scheduled   

## 2016-11-28 ENCOUNTER — Encounter: Payer: Self-pay | Admitting: Adult Health

## 2016-11-28 ENCOUNTER — Ambulatory Visit (INDEPENDENT_AMBULATORY_CARE_PROVIDER_SITE_OTHER): Payer: Medicare Other | Admitting: Adult Health

## 2016-11-28 VITALS — BP 136/72 | HR 70 | Ht 61.0 in | Wt 207.2 lb

## 2016-11-28 DIAGNOSIS — J40 Bronchitis, not specified as acute or chronic: Secondary | ICD-10-CM | POA: Diagnosis not present

## 2016-11-28 DIAGNOSIS — R5383 Other fatigue: Secondary | ICD-10-CM

## 2016-11-28 DIAGNOSIS — E785 Hyperlipidemia, unspecified: Secondary | ICD-10-CM

## 2016-11-28 DIAGNOSIS — G8929 Other chronic pain: Secondary | ICD-10-CM | POA: Diagnosis not present

## 2016-11-28 DIAGNOSIS — I10 Essential (primary) hypertension: Secondary | ICD-10-CM | POA: Diagnosis not present

## 2016-11-28 MED ORDER — ATORVASTATIN CALCIUM 10 MG PO TABS: 10.0000 mg | ORAL_TABLET | Freq: Every day | ORAL | 3 refills | Status: DC

## 2016-11-28 MED ORDER — AZITHROMYCIN 250 MG PO TABS: ORAL_TABLET | ORAL | 0 refills | Status: DC

## 2016-11-28 MED ORDER — LISINOPRIL-HYDROCHLOROTHIAZIDE 20-25 MG PO TABS: 1.0000 | ORAL_TABLET | Freq: Every day | ORAL | 3 refills | Status: AC

## 2016-11-28 MED ORDER — GLIPIZIDE 5 MG PO TABS: 5.0000 mg | ORAL_TABLET | Freq: Two times a day (BID) | ORAL | 0 refills | Status: DC

## 2016-11-28 NOTE — Progress Notes (Signed)
Subjective:    Patient ID: Melissa Rosario, female    DOB: 08/02/1960, 56 y.o.   MRN: [id number]  HPI:  Melissa Rosario is here for several issues: 1) Extreme fatigue-she would like TSH level drawn 2) She is unable to afford Pitavastatin and requests diff statin.  She cannot recall last dose of Livalo 3) Chronic, diffuse pain- requests pain management referral 4) Productive, constant cough that has been present >2.5 weeks.  She also reports sore throat 1/10 and post nasal gtt.  She denies fever/N/V/D 5) She would like her lisinopril/HCTZ combined into one tablet She denies CP/dyspnea at rest/palpiations.   I reviewed documentation from her last OV with Dr. Pearlean BrownieSethi. She is trying to secure disability status and until that time she remains without health insurance. [facility].   Patient Care Team    Relationship Specialty Notifications Start End  William Hamburgeranford, Stasha Naraine D, NP PCP - General Family Medicine  10/10/16   Levy PupaGriessel, Chanda A, MD Referring Physician Ophthalmology  10/10/16   Thea GistKraft, Howard A., MD Referring Physician Cardiology  10/10/16   Prescott ParmaJanjua, Rashid, MD Physician Assistant Neurosurgery  10/10/16     Patient Active Problem List   Diagnosis Date Noted  . Other chronic pain 11/28/2016  . Fatigue 11/28/2016  . Benign essential HTN 11/28/2016  . Bronchitis 11/28/2016  . HTN, goal below 140/90 10/10/2016  . Hypertensive urgency 06/12/2016  . Hyperlipidemia 06/12/2016  . Diabetes mellitus type II, controlled (HCC) 06/12/2016  . Morbid obesity (HCC) 06/12/2016  . Hydrocephalus with operating shunt 06/12/2016  . Lip edema 06/12/2016  . Anxiety 06/12/2016  . Stroke-like episode (HCC) R brain s/p IV tPA 06/10/2016  . S/P VP shunt 03/22/2015  . Abnormal ultrasound of abdomen 03/15/2015  . IIH (idiopathic intracranial hypertension) 11/05/2014  . Other abnormal glucose 12/14/2009     Past Medical History:  Diagnosis Date  . Diabetes mellitus without complication (HCC)   .  Fibromyalgia   . Headache   . Hearing loss   . Hyperlipidemia   . Hypertension   . Stroke [facility](HCC)      Past Surgical History:  Procedure Laterality Date  . APPENDECTOMY  1982  . CHOLECYSTECTOMY  1982  . SHUNT REPLACEMENT  2016  . SHUNT REVISION  2018  . STAPEDES SURGERY Bilateral 2007  . TONSILLECTOMY  1974  . VENTRICULOPERITONEAL SHUNT  10/2015     Family History  Problem Relation Age of Onset  . Hypertension Mother   . Alzheimer's disease Mother   . Lung cancer Father   . Hypertension Father   . Hypertension Sister   . Alcoholism Brother   . Hypertension Brother   . Diabetes Brother   . Stroke Brother   . Heart attack Maternal Grandfather   . Heart attack Paternal Grandfather   . Hypertension Sister      History  Drug Use No     History  Alcohol Use No     History  Smoking Status  . Never Smoker  Smokeless Tobacco  . Never Used     Outpatient Encounter Prescriptions as of 11/28/2016  Medication Sig  . amLODipine (NORVASC) 10 MG tablet Take 1 tablet (10 mg total) by mouth daily.  . baclofen (LIORESAL) 10 MG tablet Take 1 tablet (10 mg total) by mouth 3 (three) times daily. (Patient taking differently: Take 10 mg by mouth 3 (three) times daily as needed. )  . BAYER MICROLET LANCETS lancets by Other route daily. Use as instructed  .  brimonidine (ALPHAGAN) 0.15 % ophthalmic solution Place 1 drop into both eyes 3 (three) times daily.  . clobetasol ointment (TEMOVATE) 0.05 % Apply 1 application topically 2 (two) times daily.  . cyclobenzaprine (FLEXERIL) 10 MG tablet Take 10 mg by mouth 3 (three) times daily as needed for muscle spasms.  Marland Kitchen glipiZIDE (GLUCOTROL) 5 MG tablet Take 1 tablet (5 mg total) by mouth 2 (two) times daily before a meal.  . glucose blood (BAYER CONTOUR TEST) test strip 1 each by Other route daily. Use as instructed  . ketoconazole (NIZORAL) 2 % cream Apply topically.  . latanoprost (XALATAN) 0.005 % ophthalmic solution Place 1 drop into  both eyes at bedtime.  . lidocaine (LMX) 4 % cream Apply 1 application topically daily as needed.  Marland Kitchen Lifitegrast (XIIDRA) 5 % SOLN Apply 1 drop to eye 2 (two) times daily.  Marland Kitchen nystatin cream (MYCOSTATIN) Apply 1 application topically 2 (two) times daily as needed for dry skin.  Marland Kitchen omeprazole (PRILOSEC) 40 MG capsule Take 40 mg by mouth 2 (two) times daily.   . timolol (BETIMOL) 0.5 % ophthalmic solution Place 1 drop into both eyes 2 (two) times daily.  Marland Kitchen topiramate (TOPAMAX) 50 MG tablet Take 1 tablet (50 mg total) by mouth 3 (three) times daily as needed.  . [DISCONTINUED] glipiZIDE (GLUCOTROL) 5 MG tablet Take 1 tablet (5 mg total) by mouth 2 (two) times daily before a meal.  . [DISCONTINUED] hydrochlorothiazide (HYDRODIURIL) 25 MG tablet Take 1 tablet (25 mg total) by mouth daily.  . [DISCONTINUED] lisinopril (PRINIVIL,ZESTRIL) 20 MG tablet Take 1 tablet (20 mg total) by mouth daily.  Marland Kitchen atorvastatin (LIPITOR) 10 MG tablet Take 1 tablet (10 mg total) by mouth daily.  Marland Kitchen azithromycin (ZITHROMAX) 250 MG tablet 2 tabs day one.  1 tab days 2-5  . lisinopril-hydrochlorothiazide (PRINZIDE,ZESTORETIC) 20-25 MG tablet Take 1 tablet by mouth daily.  . [DISCONTINUED] fluconazole (DIFLUCAN) 150 MG tablet Take 150 mg by mouth as needed.  . [DISCONTINUED] LORazepam (ATIVAN) 1 MG tablet Take 1 mg by mouth 2 (two) times daily.   . [DISCONTINUED] Pitavastatin Calcium (LIVALO) 2 MG TABS Take 1 tablet (2 mg total) by mouth daily.  . [DISCONTINUED] pregabalin (LYRICA) 75 MG capsule Take 75 mg by mouth as needed.    No facility-administered encounter medications on file as of 11/28/2016.     Allergies: Hydromorphone hcl; Lovastatin; and Nsaids  Body mass index is 39.15 kg/m.  Blood pressure 136/72, pulse 70, height 5\' 1"  (1.549 m), weight 207 lb 3.2 oz (94 kg), last menstrual period 08/24/1985.      Review of Systems  Constitutional: Negative for activity change, appetite change, chills, diaphoresis,  fever and unexpected weight change.  HENT: Positive for congestion, postnasal drip, sinus pain, sinus pressure, sore throat and voice change. Negative for trouble swallowing.   Eyes: Negative for visual disturbance.  Respiratory: Positive for cough. Negative for chest tightness, shortness of breath, wheezing and stridor.   Cardiovascular: Negative for chest pain, palpitations and leg swelling.  Gastrointestinal: Negative for abdominal distention, abdominal pain, blood in stool, constipation, diarrhea, nausea and vomiting.  Endocrine: Negative for cold intolerance, heat intolerance, polydipsia, polyphagia and polyuria.  Genitourinary: Negative for difficulty urinating and flank pain.  Musculoskeletal: Positive for arthralgias, back pain, gait problem, joint swelling, myalgias, neck pain and neck stiffness.  Skin: Negative for color change, pallor, rash and wound.  Neurological: Negative for headaches.  Hematological: Does not bruise/bleed easily.       Objective:  Physical Exam  Constitutional: She is oriented to person, place, and time. She appears well-developed and well-nourished. No distress.  HENT:  Head: Normocephalic and atraumatic.  Right Ear: External ear and ear canal normal. Tympanic membrane is bulging. Tympanic membrane is not erythematous. Decreased hearing is noted.  Left Ear: External ear and ear canal normal. Tympanic membrane is bulging. Tympanic membrane is not erythematous. Decreased hearing is noted.  Nose: Mucosal edema present. Right sinus exhibits maxillary sinus tenderness and frontal sinus tenderness. Left sinus exhibits maxillary sinus tenderness and frontal sinus tenderness.  Mouth/Throat: Uvula is midline. Posterior oropharyngeal erythema present. No oropharyngeal exudate, posterior oropharyngeal edema or tonsillar abscesses.  Eyes: Pupils are equal, round, and reactive to light. Conjunctivae are normal.  Neck: Normal range of motion. Neck supple.   Cardiovascular: Normal rate, regular rhythm, normal heart sounds and intact distal pulses.   No murmur heard. Pulmonary/Chest: Effort normal and breath sounds normal. No respiratory distress. She has no wheezes. She has no rales. She exhibits no tenderness.  Musculoskeletal: She exhibits tenderness.  Lymphadenopathy:    She has cervical adenopathy.  Neurological: She is alert and oriented to person, place, and time.  Skin: Skin is warm and dry. No rash noted. She is not diaphoretic. No erythema. No pallor.  Psychiatric: She has a normal mood and affect. Her behavior is normal. Judgment and thought content normal.  Nursing note and vitals reviewed.         Assessment & Plan:   1. Fatigue, unspecified type   2. Other chronic pain   3. Benign essential HTN   4. Bronchitis   5. Hyperlipidemia, unspecified hyperlipidemia type     Benign essential HTN BP at goal 136/72, goal per Neuro <130/90 Combined Lisinopril/HCTZ per pt request. She denies current cardiac sx's.  Fatigue TSH level drawn today.  Other chronic pain Recommend daily stretching. Water aerobics, stationary bike, walking as tolerated. Pain Clinic referral placed.  Hyperlipidemia She is unable to afford Pitavastatin. Changed to Atorvastatin 10mg , per dosage conversion recommendation. She denies adverse affects/reactions to statins, she reports myalgia. Recommended to stay well hydrated to avoid myalgia. LDL goal <70 Will check fasting labs at CPE  Bronchitis Zpack Push fluids/rest/vit c    FOLLOW-UP:  Return in about 3 months (around 02/27/2017) for CPE, Fasting Lab Draw.

## 2016-11-28 NOTE — Assessment & Plan Note (Signed)
Zpack Push fluids/rest/vit c

## 2016-11-28 NOTE — Patient Instructions (Addendum)
Heart-Healthy Eating Plan Many factors influence your heart health, including eating and exercise habits. Heart (coronary) risk increases with abnormal blood fat (lipid) levels. Heart-healthy meal planning includes limiting unhealthy fats, increasing healthy fats, and making other small dietary changes. This includes maintaining a healthy body weight to help keep lipid levels within a normal range. What is my plan? Your health care provider recommends that you:  Get no more than __25__% of the total calories in your daily diet from fat.  Limit your intake of saturated fat to less than __5___% of your total calories each day. Limit the amount of cholesterol in your diet to less than __ Hypertension Hypertension, commonly called high blood pressure, is when the force of blood pumping through the arteries is too strong. The arteries are the blood vessels that carry blood from the heart throughout the body. Hypertension forces the heart to work harder to pump blood and may cause arteries to become narrow or stiff. Having untreated or uncontrolled hypertension can cause heart attacks, strokes, kidney disease, and other problems. A blood pressure reading consists of a higher number over a lower number. Ideally, your blood pressure should be below 120/80. The first ("top") number is called the systolic pressure. It is a measure of the pressure in your arteries as your heart beats. The second ("bottom") number is called the diastolic pressure. It is a measure of the pressure in your arteries as the heart relaxes. What are the causes? The cause of this condition is not known. What increases the risk? Some risk factors for high blood pressure are under your control. Others are not. Factors you can change  Smoking.  Having type 2 diabetes mellitus, high cholesterol, or both.  Not getting enough exercise or physical activity.  Being overweight.  Having too much fat, sugar, calories, or salt (sodium) in  your diet.  Drinking too much alcohol. Factors that are difficult or impossible to change  Having chronic kidney disease.  Having a family history of high blood pressure.  Age. Risk increases with age.  Race. You may be at higher risk if you are African-American.  Gender. Men are at higher risk than women before age 8. After age 22, women are at higher risk than men.  Having obstructive sleep apnea.  Stress. What are the signs or symptoms? Extremely high blood pressure (hypertensive crisis) may cause:  Headache.  Anxiety.  Shortness of breath.  Nosebleed.  Nausea and vomiting.  Severe chest pain.  Jerky movements you cannot control (seizures).  How is this diagnosed? This condition is diagnosed by measuring your blood pressure while you are seated, with your arm resting on a surface. The cuff of the blood pressure monitor will be placed directly against the skin of your upper arm at the level of your heart. It should be measured at least twice using the same arm. Certain conditions can cause a difference in blood pressure between your right and left arms. Certain factors can cause blood pressure readings to be lower or higher than normal (elevated) for a short period of time:  When your blood pressure is higher when you are in a health care provider's office than when you are at home, this is called white coat hypertension. Most people with this condition do not need medicines.  When your blood pressure is higher at home than when you are in a health care provider's office, this is called masked hypertension. Most people with this condition may need medicines to control blood  pressure.  If you have a high blood pressure reading during one visit or you have normal blood pressure with other risk factors:  You may be asked to return on a different day to have your blood pressure checked again.  You may be asked to monitor your blood pressure at home for 1 week or  longer.  If you are diagnosed with hypertension, you may have other blood or imaging tests to help your health care provider understand your overall risk for other conditions. How is this treated? This condition is treated by making healthy lifestyle changes, such as eating healthy foods, exercising more, and reducing your alcohol intake. Your health care provider may prescribe medicine if lifestyle changes are not enough to get your blood pressure under control, and if:  Your systolic blood pressure is above 130.  Your diastolic blood pressure is above 80.  Your personal target blood pressure may vary depending on your medical conditions, your age, and other factors. Follow these instructions at home: Eating and drinking  Eat a diet that is high in fiber and potassium, and low in sodium, added sugar, and fat. An example eating plan is called the DASH (Dietary Approaches to Stop Hypertension) diet. To eat this way: ? Eat plenty of fresh fruits and vegetables. Try to fill half of your plate at each meal with fruits and vegetables. ? Eat whole grains, such as whole wheat pasta, brown rice, or whole grain bread. Fill about one quarter of your plate with whole grains. ? Eat or drink low-fat dairy products, such as skim milk or low-fat yogurt. ? Avoid fatty cuts of meat, processed or cured meats, and poultry with skin. Fill about one quarter of your plate with lean proteins, such as fish, chicken without skin, beans, eggs, and tofu. ? Avoid premade and processed foods. These tend to be higher in sodium, added sugar, and fat.  Reduce your daily sodium intake. Most people with hypertension should eat less than 1,500 mg of sodium a day.  Limit alcohol intake to no more than 1 drink a day for nonpregnant women and 2 drinks a day for men. One drink equals 12 oz of beer, 5 oz of wine, or 1 oz of hard liquor. Lifestyle  Work with your health care provider to maintain a healthy body weight or to lose  weight. Ask what an ideal weight is for you.  Get at least 30 minutes of exercise that causes your heart to beat faster (aerobic exercise) most days of the week. Activities may include walking, swimming, or biking.  Include exercise to strengthen your muscles (resistance exercise), such as pilates or lifting weights, as part of your weekly exercise routine. Try to do these types of exercises for 30 minutes at least 3 days a week.  Do not use any products that contain nicotine or tobacco, such as cigarettes and e-cigarettes. If you need help quitting, ask your health care provider.  Monitor your blood pressure at home as told by your health care provider.  Keep all follow-up visits as told by your health care provider. This is important. Medicines  Take over-the-counter and prescription medicines only as told by your health care provider. Follow directions carefully. Blood pressure medicines must be taken as prescribed.  Do not skip doses of blood pressure medicine. Doing this puts you at risk for problems and can make the medicine less effective.  Ask your health care provider about side effects or reactions to medicines that you should watch  for. Contact a health care provider if:  You think you are having a reaction to a medicine you are taking.  You have headaches that keep coming back (recurring).  You feel dizzy.  You have swelling in your ankles.  You have trouble with your vision. Get help right away if:  You develop a severe headache or confusion.  You have unusual weakness or numbness.  You feel faint.  You have severe pain in your chest or abdomen.  You vomit repeatedly.  You have trouble breathing. Summary  Hypertension is when the force of blood pumping through your arteries is too strong. If this condition is not controlled, it may put you at risk for serious complications.  Your personal target blood pressure may vary depending on your medical conditions,  your age, and other factors. For most people, a normal blood pressure is less than 120/80.  Hypertension is treated with lifestyle changes, medicines, or a combination of both. Lifestyle changes include weight loss, eating a healthy, low-sodium diet, exercising more, and limiting alcohol. This information is not intended to replace advice given to you by your health care provider. Make sure you discuss any questions you have with your health care provider. Document Released: 03/12/2005 Document Revised: 02/08/2016 Document Reviewed: 02/08/2016 Elsevier Interactive Patient Education  2018 [employer].  300__ mg per day.  What types of fat should I choose?  Choose healthy fats more often. Choose monounsaturated and polyunsaturated fats, such as olive oil and canola oil, flaxseeds, walnuts, almonds, and seeds.  Eat more omega-3 fats. Good choices include salmon, mackerel, sardines, tuna, flaxseed oil, and ground flaxseeds. Aim to eat fish at least two times each week.  Limit saturated fats. Saturated fats are primarily found in animal products, such as meats, butter, and cream. Plant sources of saturated fats include palm oil, palm kernel oil, and coconut oil.  Avoid foods with partially hydrogenated oils in them. These contain trans fats. Examples of foods that contain trans fats are stick margarine, some tub margarines, cookies, crackers, and other baked goods. What general guidelines do I need to follow?  Check food labels carefully to identify foods with trans fats or high amounts of saturated fat.  Fill one half of your plate with vegetables and green salads. Eat 4-5 servings of vegetables per day. A serving of vegetables equals 1 cup of raw leafy vegetables,  cup of raw or cooked cut-up vegetables, or  cup of vegetable juice.  Fill one fourth of your plate with whole grains. Look for the word "whole" as the first word in the ingredient list.  Fill one fourth of your plate with lean  protein foods.  Eat 4-5 servings of fruit per day. A serving of fruit equals one medium whole fruit,  cup of dried fruit,  cup of fresh, frozen, or canned fruit, or  cup of 100% fruit juice.  Eat more foods that contain soluble fiber. Examples of foods that contain this type of fiber are apples, broccoli, carrots, beans, peas, and barley. Aim to get 20-30 g of fiber per day.  Eat more home-cooked food and less restaurant, buffet, and fast food.  Limit or avoid alcohol.  Limit foods that are high in starch and sugar.  Avoid fried foods.  Cook foods by using methods other than frying. Baking, boiling, grilling, and broiling are all great options. Other fat-reducing suggestions include: ? Removing the skin from poultry. ? Removing all visible fats from meats. ? Skimming the fat off of  stews, soups, and gravies before serving them. ? Steaming vegetables in water or broth.  Lose weight if you are overweight. Losing just 5-10% of your initial body weight can help your overall health and prevent diseases such as diabetes and heart disease.  Increase your consumption of nuts, legumes, and seeds to 4-5 servings per week. One serving of dried beans or legumes equals  cup after being cooked, one serving of nuts equals 1 ounces, and one serving of seeds equals  ounce or 1 tablespoon.  You may need to monitor your salt (sodium) intake, especially if you have high blood pressure. Talk with your health care provider or dietitian to get more information about reducing sodium. What foods can I eat? Grains  Breads, including [place], white, pita, wheat, raisin, rye, oatmeal, and [place]. Tortillas that are neither fried nor made with lard or trans fat. Low-fat rolls, including hotdog and hamburger buns and English muffins. Biscuits. Muffins. Waffles. Pancakes. Light popcorn. Whole-grain cereals. Flatbread. Melba toast. Pretzels. Breadsticks. Rusks. Low-fat snacks and crackers, including oyster,  saltine, matzo, graham, animal, and rye. Rice and pasta, including brown rice and those that are made with whole wheat. Vegetables All vegetables. Fruits All fruits, but limit coconut. Meats and Other Protein Sources Lean, well-trimmed beef, veal, pork, and lamb. Chicken and [place] without skin. All fish and shellfish. Wild duck, rabbit, pheasant, and venison. Egg whites or low-cholesterol egg substitutes. Dried beans, peas, lentils, and tofu.Seeds and most nuts. Dairy Low-fat or nonfat cheeses, including ricotta, string, and mozzarella. Skim or 1% milk that is liquid, powdered, or evaporated. Buttermilk that is made with low-fat milk. Nonfat or low-fat yogurt. Beverages Mineral water. Diet carbonated beverages. Sweets and Desserts Sherbets and fruit ices. Honey, jam, marmalade, jelly, and syrups. Meringues and gelatins. Pure sugar candy, such as hard candy, jelly beans, gumdrops, mints, marshmallows, and small amounts of dark chocolate. [employer]GM MIR[employer] food cake. Eat all sweets and desserts in moderation. Fats and Oils Nonhydrogenated (trans-free) margarines. Vegetable oils, including soybean, sesame, sunflower, olive, peanut, safflower, corn, canola, and cottonseed. Salad dressings or mayonnaise that are made with a vegetable oil. Limit added fats and oils that you use for cooking, baking, salads, and as spreads. Other Cocoa powder. Coffee and tea. All seasonings and condiments. The items listed above may not be a complete list of recommended foods or beverages. Contact your dietitian for more options. What foods are not recommended? Grains Breads that are made with saturated or trans fats, oils, or whole milk. Croissants. Butter rolls. Cheese breads. Sweet rolls. Donuts. Buttered popcorn. Chow mein noodles. High-fat crackers, such as cheese or butter crackers. Meats and Other Protein Sources Fatty meats, such as hotdogs, short ribs, sausage, spareribs, bacon, ribeye roast or steak, and mutton.  High-fat deli meats, such as salami and bologna. Caviar. Domestic duck and goose. Organ meats, such as kidney, liver, sweetbreads, brains, gizzard, chitterlings, and heart. Dairy Cream, sour cream, cream cheese, and creamed cottage cheese. Whole milk cheeses, including blue (bleu), [address] St[address] [place], S[place]rie, [place], [address], [place], [address], Hov[place]dar, [place]embert, and [place]. Whole or 2% milk that is liquid, evaporated, or condensed. Whole buttermilk. Cream sauce or high-fat cheese sauce. Yogurt that is made from whole milk. Beverages Regular sodas and drinks with added sugar. Sweets and Desserts Frosting. Pudding. Cookies. Cakes other than angel food cake. Candy that has milk chocolate or white chocolate, hydrogenated fat, butter, coconut, or unknown ingredients. Buttered syrups. Full-fat ice cream or ice cream drinks. Fats and Oils Gravy that has suet, meat  fat, or shortening. Cocoa butter, hydrogenated oils, palm oil, coconut oil, palm kernel oil. These can often be found in baked products, candy, fried foods, nondairy creamers, and whipped toppings. Solid fats and shortenings, including bacon fat, salt pork, lard, and butter. Nondairy cream substitutes, such as coffee creamers and sour cream substitutes. Salad dressings that are made of unknown oils, cheese, or sour cream. The items listed above may not be a complete list of foods and beverages to avoid. Contact your dietitian for more information. This information is not intended to replace advice given to you by your health care provider. Make sure you discuss any questions you have with your health care provider. Document Released: 12/20/2007 Document Revised: 09/30/2015 Document Reviewed: 09/03/2013 Elsevier Interactive Patient Education  2017 [employer].  Please take all medications as directed. Increase regular movement as tolerated.  Referral to pain clinic placed. If cough persists after antibiotic completed, please call clinic. Stay well  hydrated to avoid muscle cramps with the Atorvastatin. We will call when your TSH level results are available. Continue with Neurology as directed. Please schedule full physical with fasting labs this winter. NICE TO SEE YOU!

## 2016-11-28 NOTE — Assessment & Plan Note (Signed)
She is unable to afford Pitavastatin. Changed to Atorvastatin 10mg , per dosage conversion recommendation. She denies adverse affects/reactions to statins, she reports myalgia. Recommended to stay well hydrated to avoid myalgia. LDL goal <70 Will check fasting labs at CPE

## 2016-11-28 NOTE — Assessment & Plan Note (Signed)
BP at goal 136/72, goal per Neuro <130/90 Combined Lisinopril/HCTZ per pt request. She denies current cardiac sx's.

## 2016-11-28 NOTE — Assessment & Plan Note (Signed)
Recommend daily stretching. Water aerobics, stationary bike, walking as tolerated. Pain Clinic referral placed.

## 2016-11-28 NOTE — Assessment & Plan Note (Signed)
TSH level drawn today.

## 2016-11-29 LAB — TSH: TSH: 2.2 u[IU]/mL (ref 0.450–4.500)

## 2016-12-03 ENCOUNTER — Ambulatory Visit: Payer: MEDICAID | Admitting: Neurology

## 2017-01-03 ENCOUNTER — Encounter: Payer: Self-pay | Admitting: Internal Medicine

## 2017-01-03 ENCOUNTER — Ambulatory Visit (INDEPENDENT_AMBULATORY_CARE_PROVIDER_SITE_OTHER): Payer: Self-pay | Admitting: Internal Medicine

## 2017-01-03 VITALS — BP 130/82 | HR 75 | Ht 62.0 in | Wt 212.0 lb

## 2017-01-03 DIAGNOSIS — E1165 Type 2 diabetes mellitus with hyperglycemia: Secondary | ICD-10-CM

## 2017-01-03 DIAGNOSIS — E1159 Type 2 diabetes mellitus with other circulatory complications: Secondary | ICD-10-CM

## 2017-01-03 LAB — POCT GLYCOSYLATED HEMOGLOBIN (HGB A1C): Hemoglobin A1C: 6.2

## 2017-01-03 MED ORDER — GLIPIZIDE ER 5 MG PO TB24: 5.0000 mg | ORAL_TABLET | Freq: Every day | ORAL | 3 refills | Status: DC

## 2017-01-03 NOTE — Progress Notes (Signed)
Patient ID: Melissa Rosario, female   DOB: 1960/07/21, 56 y.o.   MRN: [id number]   HPI: Melissa Rosario is a 56 y.o.-year-old female, referred by her PCP, Melissa Rosario, Melissa Blossom, NP, for management of DM2, dx in 2014, non-insulin-dependent, uncontrolled, with complications (CVA - RMCA stroke, PN, CKD). She is here with her husband who offers part of the history especially regarding her meals and her symptomatic episodes.  Last hemoglobin A1c was: Lab Results  Component Value Date   HGBA1C 6.2 10/10/2016   HGBA1C 6.4 (H) 06/11/2016   HGBA1C 6.7 01/17/2016   Pt is on a regimen of: - Glipizide 10 >> 5 mg 2x a day before b'fast and at bedtime (!) (decreased 09/2016) She was on Metformin >> stomach pain and diarrhea  Pt checks her sugars 2-3x a day and they are: - am: 79, 80, 94-125, 155, 233 - 2h after b'fast: 230 - before lunch: 92 - 2h after lunch: n/c - before dinner: n/c - 2h after dinner: 114-132, 177 - bedtime: n/c - nighttime: n/c Lowest sugar was 39 and 57 (06/2016); she has hypoglycemia awareness at 70.  Highest sugar was 233.  She describes that she gets hot ~1h after she eats >> sugars are not necessarily low then.  Glucometer: [employer]  Pt's meals are: - Breakfast: 3 slices [place] bacon, 2 eggs, cheese, spinack - Lunch: salad - Dinner: chicken, [place] burger, 2 veggies - Snacks: 2-3 x a day: fruit or nuts  - + mild CKD, last BUN/creatinine:  Lab Results  Component Value Date   BUN 18 06/10/2016   BUN 15 06/10/2016   CREATININE 1.00 06/10/2016   CREATININE 1.11 (H) 06/10/2016  On Lisinopril 20. - last set of lipids: Lab Results  Component Value Date   CHOL 198 06/11/2016   HDL 57 06/11/2016   LDLCALC 127 (H) 06/11/2016   TRIG 70 06/11/2016   CHOLHDL 3.5 06/11/2016  On Livalo 2 mg daily. - last eye exam was in 2017. No DR. + glaucoma. - + numbness and tingling in her feet and hands  Pt has FH of DM in Brother, GM.  She also has a history of sleep apnea,  glaucoma, fibromyalgia, PCOS, esophageal stricture (stretched 2012), hyperlipidemia, hypertension.  ROS: Constitutional: no weight gain/loss, + fatigue, + subjective hyperthermia Eyes: no blurry vision, no xerophthalmia ENT: no sore throat, no nodules palpated in throat, + dysphagia/no odynophagia, no hoarseness, + hypoacusis, + tinnitus Cardiovascular: no CP/SOB/palpitations/leg swelling Respiratory: no cough/SOB Gastrointestinal: + N/+ V/no D/C Musculoskeletal: + Both: muscle/joint aches Skin: + rash Neurological: no tremors/numbness/tingling/dizziness, + HA Psychiatric: + both depression/anxiety  Past Medical History:  Diagnosis Date  . Diabetes mellitus without complication (HCC)   . Fibromyalgia   . Headache   . Hearing loss   . Hyperlipidemia   . Hypertension   . Stroke [facility])    Past Surgical History:  Procedure Laterality Date  . APPENDECTOMY  1982  . CHOLECYSTECTOMY  1982  . SHUNT REPLACEMENT  2016  . SHUNT REVISION  2018  . STAPEDES SURGERY Bilateral 2007  . TONSILLECTOMY  1974  . VENTRICULOPERITONEAL SHUNT  10/2015   Social History   Social History  . Marital status: Married    Spouse name: N/A  . Number of children: 3   Occupational History  . Unemployed    Social History Main Topics  . Smoking status: Never Smoker  . Smokeless tobacco: Never Used  . Alcohol use No  . Drug use: No  Current Outpatient Prescriptions on File Prior to Visit  Medication Sig Dispense Refill  . amLODipine (NORVASC) 10 MG tablet Take 1 tablet (10 mg total) by mouth daily. 90 tablet 1  . atorvastatin (LIPITOR) 10 MG tablet Take 1 tablet (10 mg total) by mouth daily. 90 tablet 3  . baclofen (LIORESAL) 10 MG tablet Take 1 tablet (10 mg total) by mouth 3 (three) times daily. (Patient taking differently: Take 10 mg by mouth 3 (three) times daily as needed. ) 30 each 3  . BAYER MICROLET LANCETS lancets by Other route daily. Use as instructed    . brimonidine (ALPHAGAN) 0.15 %  ophthalmic solution Place 1 drop into both eyes 3 (three) times daily.    . clobetasol ointment (TEMOVATE) 0.05 % Apply 1 application topically 2 (two) times daily.    . cyclobenzaprine (FLEXERIL) 10 MG tablet Take 10 mg by mouth 3 (three) times daily as needed for muscle spasms.    Marland Kitchen glipiZIDE (GLUCOTROL) 5 MG tablet Take 1 tablet (5 mg total) by mouth 2 (two) times daily before a meal. 180 tablet 0  . glucose blood (BAYER CONTOUR TEST) test strip 1 each by Other route daily. Use as instructed    . ketoconazole (NIZORAL) 2 % cream Apply topically.    . latanoprost (XALATAN) 0.005 % ophthalmic solution Place 1 drop into both eyes at bedtime.    . lidocaine (LMX) 4 % cream Apply 1 application topically daily as needed.    Marland Kitchen Lifitegrast (XIIDRA) 5 % SOLN Apply 1 drop to eye 2 (two) times daily.    Marland Kitchen lisinopril-hydrochlorothiazide (PRINZIDE,ZESTORETIC) 20-25 MG tablet Take 1 tablet by mouth daily. 90 tablet 3  . nystatin cream (MYCOSTATIN) Apply 1 application topically 2 (two) times daily as needed for dry skin.    Marland Kitchen omeprazole (PRILOSEC) 40 MG capsule Take 40 mg by mouth 2 (two) times daily.     . timolol (BETIMOL) 0.5 % ophthalmic solution Place 1 drop into both eyes 2 (two) times daily.    Marland Kitchen topiramate (TOPAMAX) 50 MG tablet Take 1 tablet (50 mg total) by mouth 3 (three) times daily as needed. 270 tablet 1   No current facility-administered medications on file prior to visit.    Allergies  Allergen Reactions  . Hydromorphone Hcl Nausea And Vomiting  . Lovastatin Other (See Comments)    myalgia  . Nsaids    Family History  Problem Relation Age of Onset  . Hypertension Mother   . Alzheimer's disease Mother   . Lung cancer Father   . Hypertension Father   . Hypertension Sister   . Alcoholism Brother   . Hypertension Brother   . Diabetes Brother   . Stroke Brother   . Heart attack Maternal Grandfather   . Heart attack Paternal Grandfather   . Hypertension Sister     PE: BP 130/82  (BP Location: Left Arm, Patient Position: Sitting)   Pulse 75   Ht  (1.575 m)   Wt 212 lb (96.2 kg)   LMP 08/24/1985   SpO2 97%   BMI 38.78 kg/m  Wt Readings from Last 3 Encounters:  01/03/17 212 lb (96.2 kg)  11/28/16 207 lb 3.2 oz (94 kg)  10/23/16 208 lb (94.3 kg)   Constitutional: obese in NAD Eyes: PERRLA, EOMI, no exophthalmos ENT: moist mucous membranes, no thyromegaly, no cervical lymphadenopathy Cardiovascular: RRR, No MRG Respiratory: CTA B Gastrointestinal: abdomen soft, NT, ND, BS+ Musculoskeletal: no deformities, strength intact in all 4 Skin:  moist, warm, no rashes Neurological: no tremor with outstretched hands, DTR normal in all 4  ASSESSMENT: 1. DM2, non-insulin-dependent, uncontrolled (as she has low CBGs), with complications - CVA - RMCA stroke - PN - CKD  PLAN:  1. Patient with long-standing, uncontrolled diabetes, on oral antidiabetic regimen, with sugars mostly at goal, however, with occasional hyperglycemic and especially hypoglycemic spikes, on glipizide only. Unfortunately, the patient has no insurance and we can only use Tier 1 meds. I would have liked to start her on a DPP 4 inhibitor, however, the cheapest is Alogliptin at $96 per month at [facility] and she cannot afford this. In this case, I suggested to switch from regular glipizide to glipizide ER and decrease the dose to 5 mg in a.m. She feels that in a month she will be on another insurance and she may be able to afford Januvia. I advised her to let me know if this happens. Also, I advised her to carefully monitor her sugars throughout the day and let me know if she has more lows or episodes of hot flashes. In that case, we may need to back off the glipizide dose to 2.5 mg daily.  - I suggested to:  Patient Instructions  Please stop Glipizide and start Glipizide ER 5 mg before b'fast.  Please let me know if the sugars are consistently <80 or >200.  Please return in 3 months with your sugar  log.   - HbA1c today: stable, 6.2% - Continue checking sugars at different times of the day - check 1-2 times a day, rotating checks - given sugar log and advised how to fill it and to bring it at next appt  - given foot care handout and explained the principles  - given instructions for hypoglycemia management "15-15 rule"  - advised for yearly eye exams  - Return to clinic in 3 mo with sugar log   Carlus Pavlov, MD PhD [facility] Endocr[facility]

## 2017-01-03 NOTE — Patient Instructions (Signed)
Please stop Glipizide and start Glipizide ER 5 mg before b'fast.  Please let me know if the sugars are consistently <80 or >200.  Please return in 3 months with your sugar log.   PATIENT INSTRUCTIONS FOR TYPE 2 DIABETES:  **Please join MyChart!** - see attached instructions about how to join if you have not done so already.  DIET AND EXERCISE Diet and exercise is an important part of diabetic treatment.  We recommended aerobic exercise in the form of brisk walking (working between 40-60% of maximal aerobic capacity, similar to brisk walking) for 150 minutes per week (such as 30 minutes five days per week) along with 3 times per week performing 'resistance' training (using various gauge rubber tubes with handles) 5-10 exercises involving the major muscle groups (upper body, lower body and core) performing 10-15 repetitions (or near fatigue) each exercise. Start at half the above goal but build slowly to reach the above goals. If limited by weight, joint pain, or disability, we recommend daily walking in a swimming pool with water up to waist to reduce pressure from joints while allow for adequate exercise.    BLOOD GLUCOSES Monitoring your blood glucoses is important for continued management of your diabetes. Please check your blood glucoses 2-4 times a day: fasting, before meals and at bedtime (you can rotate these measurements - e.g. one day check before the 3 meals, the next day check before 2 of the meals and before bedtime, etc.).   HYPOGLYCEMIA (low blood sugar) Hypoglycemia is usually a reaction to not eating, exercising, or taking too much insulin/ other diabetes drugs.  Symptoms include tremors, sweating, hunger, confusion, headache, etc. Treat IMMEDIATELY with 15 grams of Carbs: . 4 glucose tablets .  cup regular juice/soda . 2 tablespoons raisins . 4 teaspoons sugar . 1 tablespoon honey Recheck blood glucose in 15 mins and repeat above if still symptomatic/blood glucose  <100.  RECOMMENDATIONS TO REDUCE YOUR RISK OF DIABETIC COMPLICATIONS: * Take your prescribed MEDICATION(S) * Follow a DIABETIC diet: Complex carbs, fiber rich foods, (monounsaturated and polyunsaturated) fats * AVOID saturated/trans fats, high fat foods, >2,300 mg salt per day. * EXERCISE at least 5 times a week for 30 minutes or preferably daily.  * DO NOT SMOKE OR DRINK more than 1 drink a day. * Check your FEET every day. Do not wear tightfitting shoes. Contact us if you develop an ulcer * See your EYE doctor once a year or more if needed * Get a FLU shot once a year * Get a PNEUMONIA vaccine once before and once after age 15 years  GOALS:  * Your Hemoglobin A1c of <7%  * fasting sugars need to be <130 * after meals sugars need to be <180 (2h after you start eating) * Your Systolic BP should be 140 or lower  * Your Diastolic BP should be 80 or lower  * Your HDL (Good Cholesterol) should be 40 or higher  * Your LDL (Bad Cholesterol) should be 100 or lower. * Your Triglycerides should be 150 or lower  * Your Urine microalbumin (kidney function) should be <30 * Your Body Mass Index should be 25 or lower    Please consider the following ways to cut down carbs and fat and increase fiber and micronutrients in your diet: - substitute whole grain for white bread or pasta - substitute brown rice for white rice - substitute 90-calorie flat bread pieces for slices of bread when possible - substitute sweet potatoes or yams for  white potatoes - substitute humus for margarine - substitute tofu for cheese when possible - substitute almond or rice milk for regular milk (would not drink soy milk daily due to concern for soy estrogen influence on breast cancer risk) - substitute dark chocolate for other sweets when possible - substitute water - can add lemon or orange slices for taste - for diet sodas (artificial sweeteners will trick your body that you can eat sweets without getting calories and  will lead you to overeating and weight gain in the long run) - do not skip breakfast or other meals (this will slow down the metabolism and will result in more weight gain over time)  - can try smoothies made from fruit and almond/rice milk in am instead of regular breakfast - can also try old-fashioned (not instant) oatmeal made with almond/rice milk in am - order the dressing on the side when eating salad at a restaurant (pour less than half of the dressing on the salad) - eat as little meat as possible - can try juicing, but should not forget that juicing will get rid of the fiber, so would alternate with eating raw veg./fruits or drinking smoothies - use as little oil as possible, even when using olive oil - can dress a salad with a mix of balsamic vinegar and lemon juice, for e.g. - use agave nectar, stevia sugar, or regular sugar rather than artificial sweateners - steam or broil/roast veggies  - snack on veggies/fruit/nuts (unsalted, preferably) when possible, rather than processed foods - reduce or eliminate aspartame in diet (it is in diet sodas, chewing gum, etc) Read the labels!  Try to read Dr. Janene Harvey book: "Program for Reversing Diabetes" for other ideas for healthy eating.

## 2017-01-04 ENCOUNTER — Ambulatory Visit: Payer: Self-pay | Admitting: Physical Medicine & Rehabilitation

## 2017-01-09 ENCOUNTER — Ambulatory Visit: Payer: Self-pay | Admitting: Adult Health

## 2017-01-28 ENCOUNTER — Telehealth: Payer: Self-pay | Admitting: Adult Health

## 2017-01-28 DIAGNOSIS — Z23 Encounter for immunization: Secondary | ICD-10-CM | POA: Diagnosis not present

## 2017-01-28 NOTE — Telephone Encounter (Signed)
Pt request medical assist/ PCP advised her if it's okay for her to be around her grandchild after just taking the flu shot-- Pls call her at [phone].  ---glh

## 2017-01-29 NOTE — Telephone Encounter (Signed)
Advised pt that the influenza vaccine is not a live vaccine and that she cannot give her grandchild influenza from just having received the vaccine.  Pt expressed understanding.  Melissa Rosario. Lenwood Balsam, CMA

## 2017-02-19 ENCOUNTER — Telehealth: Payer: Self-pay

## 2017-02-19 ENCOUNTER — Other Ambulatory Visit: Payer: Self-pay

## 2017-02-19 ENCOUNTER — Other Ambulatory Visit: Payer: Self-pay | Admitting: Adult Health

## 2017-02-19 ENCOUNTER — Telehealth: Payer: Self-pay | Admitting: Adult Health

## 2017-02-19 MED ORDER — OMEPRAZOLE 40 MG PO CPDR: 40.0000 mg | DELAYED_RELEASE_CAPSULE | Freq: Two times a day (BID) | ORAL | 1 refills | Status: DC

## 2017-02-19 MED ORDER — OMEPRAZOLE 20 MG PO CPDR: 20.0000 mg | DELAYED_RELEASE_CAPSULE | Freq: Two times a day (BID) | ORAL | 1 refills | Status: DC

## 2017-02-19 NOTE — Telephone Encounter (Signed)
We have not prescribed these medications for the patient previously.  Please review and refill if appropriate.   Advised pt that since she has refills outstanding at her previous pharmacy, she will need to have these RXs transferred to her new pharmacy.  Tiajuana Amass. Jahyra Sukup, CMA

## 2017-02-19 NOTE — Telephone Encounter (Signed)
I just sent in Omeprazole 40mg  BID Thanks! Orpha BurKaty

## 2017-02-19 NOTE — Progress Notes (Addendum)
Subjective:    Patient ID: Melissa Rosario, female    DOB: 31-Aug-1960, 56 y.o.   MRN: [id number]  HPI:  11/28/16 OV: Melissa Rosario is here for several issues: 1) Extreme fatigue-she would like TSH level drawn 2) She is unable to afford Pitavastatin and requests diff statin.  She cannot recall last dose of Livalo 3) Chronic, diffuse pain- requests pain management referral 4) Productive, constant cough that has been present >2.5 weeks.  She also reports sore throat 1/10 and post nasal gtt.  She denies fever/N/V/D 5) She would like her lisinopril/HCTZ combined into one tablet She denies CP/dyspnea at rest/palpiations.   I reviewed documentation from her last OV with Dr. Pearlean BrownieSethi. She is trying to secure disability status and until that time she remains without health insurance. [facility].   02/25/17 OV: Melissa Rosario is here for CPE.  She has several complaints- 1)Myalgia's r/t to fibromyalgia and statin use (previously taking Pitavastatin 2mg , however could not afford it and changed to Atorvastatin 10mg - she reports that she cannot tolerate any higher dosage statin due to myalgia.  Lipids checked today) 2) Difficulty swallowing amlodipine tablet due to size of tablet 3) She needs contact information to pain clinic that she was referred to months ago 4) Acute anxiety r/t granddaughter in ICU and wants to re-start Lorazepam 1mg  BID to treat sx's.    5) [employer] Activity release form needs to be completed to start Silver Sneakers Program 6) AM BS running >150s consistently since her Endocrinologist started her on Glipized 5mg  XL once daily.  She denies hypoglycemia episodes. She also needs BS testing supplies 7) Rash on posterior LLE and lower back r/t stress.  She reports rash on/off since 1994 when acute stress/anxiety levels increase.  She has had 3 skin bx's- all negative, told to treat with OTC antihistamine and clobetasol ointment.  Healthcare Maintenance: PAP- hx of  hysterectomy Colonoscopy- due 2022 Mammogram- order placed Patient Care Team    Relationship Specialty Notifications Start End  William Hamburgeranford, Katy D, NP PCP - General Family Medicine  10/10/16   Levy PupaGriessel, Chanda A, MD Referring Physician Ophthalmology  10/10/16   Thea GistKraft, Howard A., MD Referring Physician Cardiology  10/10/16   Prescott ParmaJanjua, Rashid, MD Physician Assistant Neurosurgery  10/10/16     Patient Active Problem List   Diagnosis Date Noted  . Healthcare maintenance 02/25/2017  . Rash 02/25/2017  . Other chronic pain 11/28/2016  . Fatigue 11/28/2016  . Benign essential HTN 11/28/2016  . Bronchitis 11/28/2016  . HTN, goal below 140/90 10/10/2016  . Hypertensive urgency 06/12/2016  . Hyperlipidemia 06/12/2016  . Poorly controlled type 2 diabetes mellitus with circulatory disorder (HCC) 06/12/2016  . Morbid obesity (HCC) 06/12/2016  . Hydrocephalus with operating shunt 06/12/2016  . Lip edema 06/12/2016  . Anxiety 06/12/2016  . Stroke-like episode (HCC) R brain s/p IV tPA 06/10/2016  . S/P VP shunt 03/22/2015  . Abnormal ultrasound of abdomen 03/15/2015  . IIH (idiopathic intracranial hypertension) 11/05/2014  . Other abnormal glucose 12/14/2009     Past Medical History:  Diagnosis Date  . Diabetes mellitus without complication (HCC)   . Fibromyalgia   . Headache   . Hearing loss   . Hyperlipidemia   . Hypertension   . Stroke [facility](HCC)      Past Surgical History:  Procedure Laterality Date  . APPENDECTOMY  1982  . CHOLECYSTECTOMY  1982  . SHUNT REPLACEMENT  2016  . SHUNT REVISION  2018  .  STAPEDES SURGERY Bilateral 2007  . TONSILLECTOMY  1974  . VENTRICULOPERITONEAL SHUNT  10/2015     Family History  Problem Relation Age of Onset  . Hypertension Mother   . Alzheimer's disease Mother   . Lung cancer Father   . Hypertension Father   . Hypertension Sister   . Alcoholism Brother   . Hypertension Brother   . Diabetes Brother   . Stroke Brother   . Heart attack  Maternal Grandfather   . Heart attack Paternal Grandfather   . Hypertension Sister      Social History   Substance and Sexual Activity  Drug Use No     Social History   Substance and Sexual Activity  Alcohol Use No     Social History   Tobacco Use  Smoking Status Never Smoker  Smokeless Tobacco Never Used     Outpatient Encounter Medications as of 02/25/2017  Medication Sig  . amLODipine (NORVASC) 5 MG tablet 2 tablets by mouth daily, to equal 10mg /daily  . ASPIRIN 81 PO Take by mouth.  Marland Kitchen atorvastatin (LIPITOR) 10 MG tablet Take 1 tablet (10 mg total) by mouth daily.  Marland Kitchen BAYER MICROLET LANCETS lancets by Other route daily. Use as instructed  . brimonidine (ALPHAGAN) 0.15 % ophthalmic solution Place 1 drop into both eyes 3 (three) times daily.  . clobetasol ointment (TEMOVATE) 0.05 % Apply 1 application topically 2 (two) times daily.  Marland Kitchen glipiZIDE (GLUCOTROL XL) 5 MG 24 hr tablet Take 1 tablet (5 mg total) by mouth daily with breakfast.  . glucose blood (BAYER CONTOUR TEST) test strip 1 each by Other route daily. Use as instructed  . ketoconazole (NIZORAL) 2 % cream Apply topically.  . latanoprost (XALATAN) 0.005 % ophthalmic solution Place 1 drop into both eyes at bedtime.  . lidocaine (LMX) 4 % cream Apply 1 application topically daily as needed.  Marland Kitchen lisinopril-hydrochlorothiazide (PRINZIDE,ZESTORETIC) 20-25 MG tablet Take 1 tablet by mouth daily.  Marland Kitchen nystatin cream (MYCOSTATIN) Apply 1 application topically 2 (two) times daily as needed for dry skin.  Marland Kitchen timolol (BETIMOL) 0.5 % ophthalmic solution Place 1 drop into both eyes 2 (two) times daily.  Marland Kitchen topiramate (TOPAMAX) 50 MG tablet Take 1 tablet (50 mg total) by mouth 3 (three) times daily as needed.  . [DISCONTINUED] amLODipine (NORVASC) 10 MG tablet Take 1 tablet (10 mg total) by mouth daily.  . cyclobenzaprine (FLEXERIL) 10 MG tablet Take 10 mg by mouth 3 (three) times daily as needed for muscle spasms.  Marland Kitchen LORazepam  (ATIVAN) 1 MG tablet Take 1 tablet (1 mg total) by mouth 2 (two) times daily as needed for anxiety.  Marland Kitchen omeprazole (PRILOSEC) 40 MG capsule Take 1 capsule (40 mg total) by mouth 2 (two) times daily.  . [DISCONTINUED] baclofen (LIORESAL) 10 MG tablet Take 1 tablet (10 mg total) by mouth 3 (three) times daily. (Patient taking differently: Take 10 mg by mouth 3 (three) times daily as needed. )  . [DISCONTINUED] Lifitegrast (XIIDRA) 5 % SOLN Apply 1 drop to eye 2 (two) times daily.  . [DISCONTINUED] LORazepam (ATIVAN) 1 MG tablet Take 1 mg by mouth every 8 (eight) hours.  . [DISCONTINUED] omeprazole (PRILOSEC) 20 MG capsule Take 1 capsule by mouth 2 (two) times daily.  . [DISCONTINUED] omeprazole (PRILOSEC) 40 MG capsule Take 1 capsule (40 mg total) by mouth 2 (two) times daily.  . [DISCONTINUED] Pitavastatin Calcium (LIVALO) 2 MG TABS Take by mouth.  . [DISCONTINUED] pregabalin (LYRICA) 75 MG capsule Take  75 mg by mouth 2 (two) times daily.  . [DISCONTINUED] rizatriptan (MAXALT) 10 MG tablet Take 10 mg by mouth as needed for migraine. May repeat in 2 hours if needed   No facility-administered encounter medications on file as of 02/25/2017.     Allergies: Hydromorphone hcl; Lovastatin; and Nsaids  Body mass index is 39.18 kg/m.  Blood pressure 116/76, pulse 84, height 5\' 2"  (1.575 m), weight 214 lb 3.2 oz (97.2 kg), last menstrual period 08/24/1985.      Review of Systems  Constitutional: Negative for activity change, appetite change, chills, diaphoresis, fever and unexpected weight change.  HENT: Positive for congestion, postnasal drip, sinus pressure, sinus pain, sore throat and voice change. Negative for trouble swallowing.   Eyes: Negative for visual disturbance.  Respiratory: Positive for cough. Negative for chest tightness, shortness of breath, wheezing and stridor.   Cardiovascular: Negative for chest pain, palpitations and leg swelling.  Gastrointestinal: Negative for abdominal  distention, abdominal pain, blood in stool, constipation, diarrhea, nausea and vomiting.  Endocrine: Negative for cold intolerance, heat intolerance, polydipsia, polyphagia and polyuria.  Genitourinary: Negative for difficulty urinating and flank pain.  Musculoskeletal: Positive for arthralgias, back pain, gait problem, joint swelling, myalgias, neck pain and neck stiffness.  Skin: Positive for rash. Negative for color change, pallor and wound.  Neurological: Negative for headaches.  Hematological: Does not bruise/bleed easily.  Psychiatric/Behavioral: Negative for dysphoric mood, self-injury, sleep disturbance and suicidal ideas. The patient is nervous/anxious.        Objective:   Physical Exam  Constitutional: She is oriented to person, place, and time. She appears well-developed and well-nourished. No distress.  HENT:  Head: Normocephalic and atraumatic.  Right Ear: External ear and ear canal normal. Tympanic membrane is not erythematous and not bulging. Decreased hearing is noted.  Left Ear: External ear and ear canal normal. Tympanic membrane is not erythematous and not bulging. Decreased hearing is noted.  Nose: Rhinorrhea present. No mucosal edema. Right sinus exhibits frontal sinus tenderness. Right sinus exhibits no maxillary sinus tenderness. Left sinus exhibits frontal sinus tenderness. Left sinus exhibits no maxillary sinus tenderness.  Mouth/Throat: Uvula is midline. No oropharyngeal exudate, posterior oropharyngeal edema, posterior oropharyngeal erythema or tonsillar abscesses.  Eyes: Conjunctivae are normal. Pupils are equal, round, and reactive to light.  Neck: Normal range of motion. Neck supple.  Cardiovascular: Normal rate, regular rhythm, normal heart sounds and intact distal pulses.  No murmur heard. Pulmonary/Chest: Effort normal and breath sounds normal. No respiratory distress. She has no wheezes. She has no rales. She exhibits no tenderness. Right breast exhibits  inverted nipple. Right breast exhibits no mass, no nipple discharge and no tenderness. Left breast exhibits inverted nipple. Left breast exhibits no mass, no nipple discharge and no tenderness.  Abdominal: Soft. Bowel sounds are normal. She exhibits no distension and no mass. There is no tenderness. There is no rebound and no guarding.  Very protuberant abdomen, difficult to palpate abdominal organs.  Musculoskeletal: She exhibits tenderness.  Lymphadenopathy:    She has no cervical adenopathy.  Neurological: She is alert and oriented to person, place, and time. She displays no tremor. She displays no seizure activity.  LUE/LLE weaker than RUE/RLE   Skin: Skin is warm and dry. Rash noted. Rash is urticarial. She is not diaphoretic. No erythema. No pallor.     Red, warm rash noted on posterior LLE and lumbar back. No open tissue, drainage, streaking noted  Psychiatric: She has a normal mood and affect. Her behavior  is normal. Judgment and thought content normal.  Nursing note and vitals reviewed.         Assessment & Plan:   1. Screening for breast cancer   2. Fatigue, unspecified type   3. Hyperlipidemia, unspecified hyperlipidemia type   4. Poorly controlled type 2 diabetes mellitus with circulatory disorder (HCC)   5. Healthcare maintenance   6. HTN, goal below 140/90   7. Rash   8. Anxiety     Healthcare maintenance Addressed: 1) labs drawn, we will call when results are available 2) [facility] 3) Please take Amlodipine 5mg  - 2 tablets daily to equal 10mg /daily.  Please let me know if swallowing continues to be a problem 4) Please call your Endocrinologist- high blood sugars and re-order on testing supplies 5) 1 month of Lorazepam provided, please make follow-up in 4 weeks to discuss treatment for anxiety, ie non benzodiazepine medications, therapy, etc.  [place] Controlled Substance Database reviewed- no  contraindications noted. 6) We will call when your Silver Sneaker Forms are completed 7) Please use Temovate Cream and OTC Antihistamine to treat rash  Hyperlipidemia Lipids obtained today Currently on Atorvastatin and encouraged to increase fluids to help reduce myalgia  Poorly controlled type 2 diabetes mellitus with circulatory disorder (HCC)  AM BS running >150s consistently since her Endocrinologist started her on Glipized 5mg  XL once daily.  She denies hypoglycemia episodes. Instructed to call her Endocrinologist  HTN, goal below 140/90 BP at goal 116/76, HR 84 Continue Prinzide 20/25mg  and Amlodipine 10mg  (2x 5 mg tabs)   Rash Please use Temovate Cream and OTC Antihistamine to treat rash  Anxiety 1 month of Lorazepam provided, please make follow-up in 4 weeks to discuss treatment for anxiety, ie non benzodiazepine medications, therapy, etc.  [place] Controlled Substance Database reviewed- no contraindications noted.    FOLLOW-UP:  Return in about 4 weeks (around 03/25/2017) for Anxiety.

## 2017-02-19 NOTE — Telephone Encounter (Signed)
Patient called states she has changed pharmacies (Now uses [facility] [address] [place]--- Pt also request refill on several medications.  Omeprazole/ Prilosec  40MG  capsules--- takes 2x's daily (Orpha BurKaty has never prescribed this) & Lisinopril HCT/Prinzide-Zestoretic 20-25 MG tablets--takes 1 daily &  Atorvastatin/ Lipitor 10 MG tablets ---takes 1 daily  --glh

## 2017-02-19 NOTE — Telephone Encounter (Signed)
Pt called to question why the omeprazole dosage was decreased.  Advised pt, per [facility], that dosage was reduced d/t possible interaction with Celexa.  Pt states that she is no longer taking Celexa.  Pt request that we resend RX for the 40mg  dosage.  Melissa Rosario. Kayron Hicklin, CMA

## 2017-02-22 ENCOUNTER — Other Ambulatory Visit: Payer: Self-pay

## 2017-02-22 DIAGNOSIS — E1159 Type 2 diabetes mellitus with other circulatory complications: Secondary | ICD-10-CM

## 2017-02-22 DIAGNOSIS — E1165 Type 2 diabetes mellitus with hyperglycemia: Principal | ICD-10-CM

## 2017-02-22 DIAGNOSIS — R5383 Other fatigue: Secondary | ICD-10-CM

## 2017-02-22 DIAGNOSIS — E785 Hyperlipidemia, unspecified: Secondary | ICD-10-CM

## 2017-02-25 ENCOUNTER — Ambulatory Visit (INDEPENDENT_AMBULATORY_CARE_PROVIDER_SITE_OTHER): Payer: Medicare HMO | Admitting: Adult Health

## 2017-02-25 ENCOUNTER — Encounter: Payer: Self-pay | Admitting: Adult Health

## 2017-02-25 ENCOUNTER — Telehealth: Payer: Self-pay | Admitting: Internal Medicine

## 2017-02-25 ENCOUNTER — Other Ambulatory Visit: Payer: Self-pay | Admitting: Adult Health

## 2017-02-25 VITALS — BP 116/76 | HR 84 | Ht 62.0 in | Wt 214.2 lb

## 2017-02-25 DIAGNOSIS — Z0000 Encounter for general adult medical examination without abnormal findings: Secondary | ICD-10-CM | POA: Diagnosis not present

## 2017-02-25 DIAGNOSIS — E1159 Type 2 diabetes mellitus with other circulatory complications: Secondary | ICD-10-CM | POA: Diagnosis not present

## 2017-02-25 DIAGNOSIS — Z1239 Encounter for other screening for malignant neoplasm of breast: Secondary | ICD-10-CM

## 2017-02-25 DIAGNOSIS — E1165 Type 2 diabetes mellitus with hyperglycemia: Secondary | ICD-10-CM

## 2017-02-25 DIAGNOSIS — E785 Hyperlipidemia, unspecified: Secondary | ICD-10-CM

## 2017-02-25 DIAGNOSIS — Z1231 Encounter for screening mammogram for malignant neoplasm of breast: Secondary | ICD-10-CM

## 2017-02-25 DIAGNOSIS — R21 Rash and other nonspecific skin eruption: Secondary | ICD-10-CM

## 2017-02-25 DIAGNOSIS — R5383 Other fatigue: Secondary | ICD-10-CM | POA: Diagnosis not present

## 2017-02-25 DIAGNOSIS — F419 Anxiety disorder, unspecified: Secondary | ICD-10-CM

## 2017-02-25 DIAGNOSIS — I10 Essential (primary) hypertension: Secondary | ICD-10-CM | POA: Diagnosis not present

## 2017-02-25 DIAGNOSIS — R69 Illness, unspecified: Secondary | ICD-10-CM | POA: Diagnosis not present

## 2017-02-25 MED ORDER — AMLODIPINE BESYLATE 5 MG PO TABS
ORAL_TABLET | ORAL | 1 refills | Status: DC
Start: 2017-02-25 — End: 2017-08-16

## 2017-02-25 MED ORDER — GLUCOSE BLOOD VI STRP: ORAL_STRIP | 1 refills | Status: DC

## 2017-02-25 MED ORDER — LORAZEPAM 1 MG PO TABS: 1.0000 mg | ORAL_TABLET | Freq: Two times a day (BID) | ORAL | 0 refills | Status: DC | PRN

## 2017-02-25 MED ORDER — ONETOUCH ULTRA 2 W/DEVICE KIT: PACK | 0 refills | Status: AC

## 2017-02-25 MED ORDER — OMEPRAZOLE 40 MG PO CPDR: 40.0000 mg | DELAYED_RELEASE_CAPSULE | Freq: Two times a day (BID) | ORAL | 1 refills | Status: DC

## 2017-02-25 NOTE — Telephone Encounter (Signed)
Patient requests prescriptions for Glucometer and Test Strips be [facility] to [facility] [address] [place]. Insurance only covers [employer] Glucometer and test strips.

## 2017-02-25 NOTE — Patient Instructions (Addendum)
Heart-Healthy Eating Plan Many factors influence your heart health, including eating and exercise habits. Heart (coronary) risk increases with abnormal blood fat (lipid) levels. Heart-healthy meal planning includes limiting unhealthy fats, increasing healthy fats, and making other small dietary changes. This includes maintaining a healthy body weight to help keep lipid levels within a normal range. What is my plan? Your health care provider recommends that you:  Get no more than ___25__% of the total calories in your daily diet from fat.  Limit your intake of saturated fat to less than ___5____% of your total calories each day.  Limit the amount of cholesterol in your diet to less than __300__ mg per day.  What types of fat should I choose?  Choose healthy fats more often. Choose monounsaturated and polyunsaturated fats, such as olive oil and canola oil, flaxseeds, walnuts, almonds, and seeds.  Eat more omega-3 fats. Good choices include salmon, mackerel, sardines, tuna, flaxseed oil, and ground flaxseeds. Aim to eat fish at least two times each week.  Limit saturated fats. Saturated fats are primarily found in animal products, such as meats, butter, and cream. Plant sources of saturated fats include palm oil, palm kernel oil, and coconut oil.  Avoid foods with partially hydrogenated oils in them. These contain trans fats. Examples of foods that contain trans fats are stick margarine, some tub margarines, cookies, crackers, and other baked goods. What general guidelines do I need to follow?  Check food labels carefully to identify foods with trans fats or high amounts of saturated fat.  Fill one half of your plate with vegetables and green salads. Eat 4-5 servings of vegetables per day. A serving of vegetables equals 1 cup of raw leafy vegetables,  cup of raw or cooked cut-up vegetables, or  cup of vegetable juice.  Fill one fourth of your plate with whole grains. Look for the word  "whole" as the first word in the ingredient list.  Fill one fourth of your plate with lean protein foods.  Eat 4-5 servings of fruit per day. A serving of fruit equals one medium whole fruit,  cup of dried fruit,  cup of fresh, frozen, or canned fruit, or  cup of 100% fruit juice.  Eat more foods that contain soluble fiber. Examples of foods that contain this type of fiber are apples, broccoli, carrots, beans, peas, and barley. Aim to get 20-30 g of fiber per day.  Eat more home-cooked food and less restaurant, buffet, and fast food.  Limit or avoid alcohol.  Limit foods that are high in starch and sugar.  Avoid fried foods.  Cook foods by using methods other than frying. Baking, boiling, grilling, and broiling are all great options. Other fat-reducing suggestions include: ? Removing the skin from poultry. ? Removing all visible fats from meats. ? Skimming the fat off of stews, soups, and gravies before serving them. ? Steaming vegetables in water or broth.  Lose weight if you are overweight. Losing just 5-10% of your initial body weight can help your overall health and prevent diseases such as diabetes and heart disease.  Increase your consumption of nuts, legumes, and seeds to 4-5 servings per week. One serving of dried beans or legumes equals  cup after being cooked, one serving of nuts equals 1 ounces, and one serving of seeds equals  ounce or 1 tablespoon.  You may need to monitor your salt (sodium) intake, especially if you have high blood pressure. Talk with your health care provider or dietitian to get  more information about reducing sodium. What foods can I eat? Grains  Breads, including French, white, pita, wheat, raisin, rye, oatmeal, and Italian. Tortillas that are neither fried nor made with lard or trans fat. Low-fat rolls, including hotdog and hamburger buns and English muffins. Biscuits. Muffins. Waffles. Pancakes. Light popcorn. Whole-grain cereals. Flatbread.  Melba toast. Pretzels. Breadsticks. Rusks. Low-fat snacks and crackers, including oyster, saltine, matzo, graham, animal, and rye. Rice and pasta, including brown rice and those that are made with whole wheat. Vegetables All vegetables. Fruits All fruits, but limit coconut. Meats and Other Protein Sources Lean, well-trimmed beef, veal, pork, and lamb. Chicken and turkey without skin. All fish and shellfish. Wild duck, rabbit, pheasant, and venison. Egg whites or low-cholesterol egg substitutes. Dried beans, peas, lentils, and tofu.Seeds and most nuts. Dairy Low-fat or nonfat cheeses, including ricotta, string, and mozzarella. Skim or 1% milk that is liquid, powdered, or evaporated. Buttermilk that is made with low-fat milk. Nonfat or low-fat yogurt. Beverages Mineral water. Diet carbonated beverages. Sweets and Desserts Sherbets and fruit ices. Honey, jam, marmalade, jelly, and syrups. Meringues and gelatins. Pure sugar candy, such as hard candy, jelly beans, gumdrops, mints, marshmallows, and small amounts of dark chocolate. Angel food cake. Eat all sweets and desserts in moderation. Fats and Oils Nonhydrogenated (trans-free) margarines. Vegetable oils, including soybean, sesame, sunflower, olive, peanut, safflower, corn, canola, and cottonseed. Salad dressings or mayonnaise that are made with a vegetable oil. Limit added fats and oils that you use for cooking, baking, salads, and as spreads. Other Cocoa powder. Coffee and tea. All seasonings and condiments. The items listed above may not be a complete list of recommended foods or beverages. Contact your dietitian for more options. What foods are not recommended? Grains Breads that are made with saturated or trans fats, oils, or whole milk. Croissants. Butter rolls. Cheese breads. Sweet rolls. Donuts. Buttered popcorn. Chow mein noodles. High-fat crackers, such as cheese or butter crackers. Meats and Other Protein Sources Fatty meats, such  as hotdogs, short ribs, sausage, spareribs, bacon, ribeye roast or steak, and mutton. High-fat deli meats, such as salami and bologna. Caviar. Domestic duck and goose. Organ meats, such as kidney, liver, sweetbreads, brains, gizzard, chitterlings, and heart. Dairy Cream, sour cream, cream cheese, and creamed cottage cheese. Whole milk cheeses, including blue (bleu), Monterey Jack, Brie, [place], American, Havarti, Swiss, cheddar, Camembert, and [place]. Whole or 2% milk that is liquid, evaporated, or condensed. Whole buttermilk. Cream sauce or high-fat cheese sauce. Yogurt that is made from whole milk. Beverages Regular sodas and drinks with added sugar. Sweets and Desserts Frosting. Pudding. Cookies. Cakes other than angel food cake. Candy that has milk chocolate or white chocolate, hydrogenated fat, butter, coconut, or unknown ingredients. Buttered syrups. Full-fat ice cream or ice cream drinks. Fats and Oils Gravy that has suet, meat fat, or shortening. Cocoa butter, hydrogenated oils, palm oil, coconut oil, palm kernel oil. These can often be found in baked products, candy, fried foods, nondairy creamers, and whipped toppings. Solid fats and shortenings, including bacon fat, salt pork, lard, and butter. Nondairy cream substitutes, such as coffee creamers and sour cream substitutes. Salad dressings that are made of unknown oils, cheese, or sour cream. The items listed above may not be a complete list of foods and beverages to avoid. Contact your dietitian for more information. This information is not intended to replace advice given to you by your health care provider. Make sure you discuss any questions you have with your health care   provider. Document Released: 12/20/2007 Document Revised: 09/30/2015 Document Reviewed: 09/03/2013 Elsevier Interactive Patient Education  2017 [place].   Generalized Anxiety Disorder, Adult Generalized anxiety disorder (GAD) is a mental health disorder. People  with this condition constantly worry about everyday events. Unlike normal anxiety, worry related to GAD is not triggered by a specific event. These worries also do not fade or get better with time. GAD interferes with life functions, including relationships, work, and school. GAD can vary from mild to severe. People with severe GAD can have intense waves of anxiety with physical symptoms (panic attacks). What are the causes? The exact cause of GAD is not known. What increases the risk? This condition is more likely to develop in:  Women.  People who have a family history of anxiety disorders.  People who are very shy.  People who experience very stressful life events, such as the death of a loved one.  People who have a very stressful family environment.  What are the signs or symptoms? People with GAD often worry excessively about many things in their lives, such as their health and family. They may also be overly concerned about:  Doing well at work.  Being on time.  Natural disasters.  Friendships.  Physical symptoms of GAD include:  Fatigue.  Muscle tension or having muscle twitches.  Trembling or feeling shaky.  Being easily startled.  Feeling like your heart is pounding or racing.  Feeling out of breath or like you cannot take a deep breath.  Having trouble falling asleep or staying asleep.  Sweating.  Nausea, diarrhea, or irritable bowel syndrome (IBS).  Headaches.  Trouble concentrating or remembering facts.  Restlessness.  Irritability.  How is this diagnosed? Your health care provider can diagnose GAD based on your symptoms and medical history. You will also have a physical exam. The health care provider will ask specific questions about your symptoms, including how severe they are, when they started, and if they come and go. Your health care provider may ask you about your use of alcohol or drugs, including prescription medicines. Your health care  provider may refer you to a mental health specialist for further evaluation. Your health care provider will do a thorough examination and may perform additional tests to rule out other possible causes of your symptoms. To be diagnosed with GAD, a person must have anxiety that:  Is out of his or her control.  Affects several different aspects of his or her life, such as work and relationships.  Causes distress that makes him or her unable to take part in normal activities.  Includes at least three physical symptoms of GAD, such as restlessness, fatigue, trouble concentrating, irritability, muscle tension, or sleep problems.  Before your health care provider can confirm a diagnosis of GAD, these symptoms must be present more days than they are not, and they must last for six months or longer. How is this treated? The following therapies are usually used to treat GAD:  Medicine. Antidepressant medicine is usually prescribed for long-term daily control. Antianxiety medicines may be added in severe cases, especially when panic attacks occur.  Talk therapy (psychotherapy). Certain types of talk therapy can be helpful in treating GAD by providing support, education, and guidance. Options include: ? Cognitive behavioral therapy (CBT). People learn coping skills and techniques to ease their anxiety. They learn to identify unrealistic or negative thoughts and behaviors and to replace them with positive ones. ? Acceptance and commitment therapy (ACT). This treatment teaches people  how to be mindful as a way to cope with unwanted thoughts and feelings. ? Biofeedback. This process trains you to manage your body's response (physiological response) through breathing techniques and relaxation methods. You will work with a therapist while machines are used to monitor your physical symptoms.  Stress management techniques. These include yoga, meditation, and exercise.  A mental health specialist can help  determine which treatment is best for you. Some people see improvement with one type of therapy. However, other people require a combination of therapies. Follow these instructions at home:  Take over-the-counter and prescription medicines only as told by your health care provider.  Try to maintain a normal routine.  Try to anticipate stressful situations and allow extra time to manage them.  Practice any stress management or self-calming techniques as taught by your health care provider.  Do not punish yourself for setbacks or for not making progress.  Try to recognize your accomplishments, even if they are small.  Keep all follow-up visits as told by your health care provider. This is important. Contact a health care provider if:  Your symptoms do not get better.  Your symptoms get worse.  You have signs of depression, such as: ? A persistently sad, cranky, or irritable mood. ? Loss of enjoyment in activities that used to bring you joy. ? Change in weight or eating. ? Changes in sleeping habits. ? Avoiding friends or family members. ? Loss of energy for normal tasks. ? Feelings of guilt or worthlessness. Get help right away if:  You have serious thoughts about hurting yourself or others. If you ever feel like you may hurt yourself or others, or have thoughts about taking your own life, get help right away. You can go to your nearest emergency department or call:  Your local emergency services (911 in the [place]  A suicide crisis helpline, such as the National Suicide Prevention Lifeline at 906-834-58031-847-612-7978. This is open 24 hours a day.  Summary  Generalized anxiety disorder (GAD) is a mental health disorder that involves worry that is not triggered by a specific event.  People with GAD often worry excessively about many things in their lives, such as their health and family.  GAD may cause physical symptoms such as restlessness, trouble concentrating, sleep problems,  frequent sweating, nausea, diarrhea, headaches, and trembling or muscle twitching.  A mental health specialist can help determine which treatment is best for you. Some people see improvement with one type of therapy. However, other people require a combination of therapies. This information is not intended to replace advice given to you by your health care provider. Make sure you discuss any questions you have with your health care provider. Document Released: 07/07/2012 Document Revised: 01/31/2016 Document Reviewed: 01/31/2016 Elsevier Interactive Patient Education  2018 [employer]   Addressed: 1) labs drawn, we will call when results are available 2) Please [facility] Joselyn [facility]yler [facility] Contact Information 3) Please take Amlodipine 5mg  - 2 tablets daily to equal 10mg /daily.  Please let me know if swallowing continues to be a problem 4) Please call your Endocrinologist- high blood sugars and re-order on testing supplies 5) 1 month of Lorazepam provided, please make follow-up in 4 weeks to discuss treatment for anxiety, ie non benzodiazepine medications, therapy, etc 6) We will call when your [employer] Forms are completed 7) Please use Temovate Cream and OTC Antihistamine to treat rash NICE TO SEE YOU!

## 2017-02-25 NOTE — Assessment & Plan Note (Signed)
AM BS running >150s consistently since her Endocrinologist started her on Glipized 5mg  XL once daily.  She denies hypoglycemia episodes. Instructed to call her Endocrinologist

## 2017-02-25 NOTE — Assessment & Plan Note (Signed)
Lipids obtained today Currently on Atorvastatin and encouraged to increase fluids to help reduce myalgia

## 2017-02-25 NOTE — Assessment & Plan Note (Signed)
1 month of Lorazepam provided, please make follow-up in 4 weeks to discuss treatment for anxiety, ie non benzodiazepine medications, therapy, etc.  [place] Controlled Substance Database reviewed- no contraindications noted.

## 2017-02-25 NOTE — Assessment & Plan Note (Signed)
Please use Temovate Cream and OTC Antihistamine to treat rash

## 2017-02-25 NOTE — Telephone Encounter (Signed)
Sent to pharmacy 

## 2017-02-25 NOTE — Assessment & Plan Note (Signed)
BP at goal 116/76, HR 84 Continue Prinzide 20/25mg  and Amlodipine 10mg  (2x 5 mg tabs)

## 2017-02-25 NOTE — Assessment & Plan Note (Addendum)
Addressed: 1) labs drawn, we will call when results are available 2) [facility] 3) Please take Amlodipine 5mg  - 2 tablets daily to equal 10mg /daily.  Please let me know if swallowing continues to be a problem 4) Please call your Endocrinologist- high blood sugars and re-order on testing supplies 5) 1 month of Lorazepam provided, please make follow-up in 4 weeks to discuss treatment for anxiety, ie non benzodiazepine medications, therapy, etc.  [place] Controlled Substance Database reviewed- no contraindications noted. 6) We will call when your [employer] Forms are completed 7) Please use Temovate Cream and OTC Antihistamine to treat rash

## 2017-02-26 ENCOUNTER — Telehealth: Payer: Self-pay

## 2017-02-26 ENCOUNTER — Other Ambulatory Visit: Payer: Self-pay | Admitting: Adult Health

## 2017-02-26 DIAGNOSIS — E559 Vitamin D deficiency, unspecified: Secondary | ICD-10-CM

## 2017-02-26 LAB — COMPREHENSIVE METABOLIC PANEL
A/G RATIO: 1.6 (ref 1.2–2.2)
ALBUMIN: 4.4 g/dL (ref 3.5–5.5)
ALT: 15 IU/L (ref 0–32)
AST: 14 IU/L (ref 0–40)
Alkaline Phosphatase: 74 IU/L (ref 39–117)
BUN / CREAT RATIO: 24 — AB (ref 9–23)
BUN: 21 mg/dL (ref 6–24)
CALCIUM: 9.6 mg/dL (ref 8.7–10.2)
CHLORIDE: 105 mmol/L (ref 96–106)
CO2: 23 mmol/L (ref 20–29)
Creatinine, Ser: 0.88 mg/dL (ref 0.57–1.00)
GFR, EST AFRICAN AMERICAN: 84 mL/min/{1.73_m2} (ref >59)
GFR, EST NON AFRICAN AMERICAN: 73 mL/min/{1.73_m2} (ref >59)
Globulin, Total: 2.7 g/dL (ref 1.5–4.5)
Glucose: 170 mg/dL — ABNORMAL HIGH (ref 65–99)
Potassium: 4.5 mmol/L (ref 3.5–5.2)
Sodium: 142 mmol/L (ref 134–144)
TOTAL PROTEIN: 7.1 g/dL (ref 6.0–8.5)

## 2017-02-26 LAB — CBC WITH DIFFERENTIAL/PLATELET
BASOS ABS: 0 10*3/uL (ref 0.0–0.2)
BASOS: 0 %
EOS (ABSOLUTE): 0.4 10*3/uL (ref 0.0–0.4)
Eos: 3 %
HEMATOCRIT: 40.4 % (ref 34.0–46.6)
HEMOGLOBIN: 13.6 g/dL (ref 11.1–15.9)
IMMATURE GRANS (ABS): 0 10*3/uL (ref 0.0–0.1)
Immature Granulocytes: 0 %
LYMPHS ABS: 2.5 10*3/uL (ref 0.7–3.1)
LYMPHS: 23 %
MCH: 30 pg (ref 26.6–33.0)
MCHC: 33.7 g/dL (ref 31.5–35.7)
MCV: 89 fL (ref 79–97)
MONOCYTES: 8 %
Monocytes Absolute: 0.9 10*3/uL (ref 0.1–0.9)
NEUTROS ABS: 7.2 10*3/uL — AB (ref 1.4–7.0)
Neutrophils: 66 %
Platelets: 241 10*3/uL (ref 150–379)
RBC: 4.54 x10E6/uL (ref 3.77–5.28)
RDW: 14.2 % (ref 12.3–15.4)
WBC: 10.9 10*3/uL — ABNORMAL HIGH (ref 3.4–10.8)

## 2017-02-26 LAB — LIPID PANEL
Chol/HDL Ratio: 2.9 ratio (ref 0.0–4.4)
Cholesterol, Total: 160 mg/dL (ref 100–199)
HDL: 56 mg/dL (ref >39)
LDL Calculated: 81 mg/dL (ref 0–99)
TRIGLYCERIDES: 114 mg/dL (ref 0–149)
VLDL Cholesterol Cal: 23 mg/dL (ref 5–40)

## 2017-02-26 LAB — VITAMIN D 25 HYDROXY (VIT D DEFICIENCY, FRACTURES): VIT D 25 HYDROXY: 19.7 ng/mL — AB (ref 30.0–100.0)

## 2017-02-26 MED ORDER — VITAMIN D (ERGOCALCIFEROL) 1.25 MG (50000 UNIT) PO CAPS: 50000.0000 [IU] | ORAL_CAPSULE | ORAL | 0 refills | Status: DC

## 2017-02-26 MED ORDER — FLUTICASONE PROPIONATE 50 MCG/ACT NA SUSP: 2.0000 | Freq: Every day | NASAL | 6 refills | Status: DC

## 2017-02-26 MED ORDER — AMOXICILLIN-POT CLAVULANATE 875-125 MG PO TABS: 1.0000 | ORAL_TABLET | Freq: Two times a day (BID) | ORAL | 0 refills | Status: DC

## 2017-02-26 NOTE — Telephone Encounter (Signed)
-----   Message from Julaine FusiKaty D Danford, NP sent at 02/26/2017  8:18 AM EST ----- Good Morning Archie Pattenonya, Can you please call Ms. Strehlow and share: Vit d low-19.7, please start once weekly rx vit d and we will re-check levels in 4 months. Lipid panek Tot- 160 TGs- 114, up from 70 eight months ago, reduce CHO intake HDL - 56, great, keep moving as much as possible LDL- 81 WOW, down from 127 eight months ago CMP-WNL CBC-WNL

## 2017-02-26 NOTE — Telephone Encounter (Signed)
Due to length of sx's I will send in ABX and flonase Thanks! Melissa Rosario

## 2017-02-26 NOTE — Telephone Encounter (Signed)
LVM for pt to call to discuss.  T. Nelson, CMA  

## 2017-02-26 NOTE — Telephone Encounter (Signed)
Good Morning Tonya, Please ask Ms. Melissa Rosario how many days the sx's have been occurring and what OTC remedies she has tried? Please tell her to increase fluids/rest/vit c (2000mg  day when ill, will help boost her immune system). Thanks! Orpha BurKaty

## 2017-02-26 NOTE — Telephone Encounter (Signed)
LVM informing pt of RXs.  T. Nelson, CMA 

## 2017-02-26 NOTE — Telephone Encounter (Signed)
Pt states that she has had these symptoms for 1 1/2 weeks and has tried Coricidin HBP.  Pt informed of additional recommendations.  Any other treatments you'd like to give her?  Tiajuana Amass. Arelly Whittenberg, CMA

## 2017-02-26 NOTE — Telephone Encounter (Signed)
Pt informed of results.  Pt expressed understanding and is agreeable.     Pt now c/o sore throat, nasal congestion with yellow/ green drainage,cough with yellow sputum production.  Denies fever, headaches, and N&V.  Patient request ABX.  Tiajuana Amass. Reymundo Winship, CMA

## 2017-02-27 DIAGNOSIS — R69 Illness, unspecified: Secondary | ICD-10-CM | POA: Diagnosis not present

## 2017-02-27 MED ORDER — BAYER MICROLET LANCETS MISC: 1 refills | Status: DC

## 2017-02-27 NOTE — Telephone Encounter (Signed)
Sent to pharmacy 

## 2017-02-27 NOTE — Telephone Encounter (Signed)
Pt called and needs BAYER MICROLET LANCETS lancets sent into pharmacy     [id number] - [place], [place] - [address][phone] (Phone)

## 2017-03-25 ENCOUNTER — Ambulatory Visit: Payer: Medicare HMO | Admitting: Adult Health

## 2017-03-28 DIAGNOSIS — H401132 Primary open-angle glaucoma, bilateral, moderate stage: Secondary | ICD-10-CM | POA: Diagnosis not present

## 2017-03-28 DIAGNOSIS — H16223 Keratoconjunctivitis sicca, not specified as Sjogren's, bilateral: Secondary | ICD-10-CM | POA: Diagnosis not present

## 2017-04-03 ENCOUNTER — Ambulatory Visit (INDEPENDENT_AMBULATORY_CARE_PROVIDER_SITE_OTHER): Payer: Medicare HMO | Admitting: Internal Medicine

## 2017-04-03 ENCOUNTER — Encounter: Payer: Self-pay | Admitting: Internal Medicine

## 2017-04-03 VITALS — BP 126/88 | HR 74 | Ht 62.0 in | Wt 219.2 lb

## 2017-04-03 DIAGNOSIS — E1159 Type 2 diabetes mellitus with other circulatory complications: Secondary | ICD-10-CM

## 2017-04-03 DIAGNOSIS — E1165 Type 2 diabetes mellitus with hyperglycemia: Secondary | ICD-10-CM

## 2017-04-03 DIAGNOSIS — B3731 Acute candidiasis of vulva and vagina: Secondary | ICD-10-CM

## 2017-04-03 DIAGNOSIS — E1142 Type 2 diabetes mellitus with diabetic polyneuropathy: Secondary | ICD-10-CM | POA: Diagnosis not present

## 2017-04-03 DIAGNOSIS — E785 Hyperlipidemia, unspecified: Secondary | ICD-10-CM

## 2017-04-03 DIAGNOSIS — B373 Candidiasis of vulva and vagina: Secondary | ICD-10-CM | POA: Diagnosis not present

## 2017-04-03 LAB — POCT GLYCOSYLATED HEMOGLOBIN (HGB A1C): Hemoglobin A1C: 7.4

## 2017-04-03 MED ORDER — SITAGLIPTIN PHOSPHATE 100 MG PO TABS: 100.0000 mg | ORAL_TABLET | Freq: Every day | ORAL | 5 refills | Status: DC

## 2017-04-03 MED ORDER — FLUCONAZOLE 150 MG PO TABS: 150.0000 mg | ORAL_TABLET | Freq: Once | ORAL | 1 refills | Status: AC

## 2017-04-03 MED ORDER — GLIPIZIDE ER 5 MG PO TB24: 5.0000 mg | ORAL_TABLET | Freq: Every day | ORAL | 3 refills | Status: DC

## 2017-04-03 NOTE — Patient Instructions (Addendum)
Please continue Glipizide ER 5 mg before b'fast.  Add Januvia 100 mg before b'fast.  Can try the following combination for neuropathy: - alpha-lipoic acid 600 mg 2x a day - B complex 1 tablet a day  Please return in 3 months with your sugar log.

## 2017-04-03 NOTE — Progress Notes (Signed)
Patient ID: Melissa Rosario, female   DOB: 1960/10/19, 57 y.o.   MRN: [id number]   HPI: Melissa Rosario is a 57 y.o.-year-old female, initially referred by her PCP, Melissa Rosario, Melissa Fuller Rosario, Melissa Rosario, returning for follow-up for DM2, dx in 2014, non-insulin-dependent, uncontrolled, with complications (CVA - RMCA stroke, PN, CKD).  Last visit 3 months ago.  She has URI.  Last hemoglobin A1c was: Lab Results  Component Value Date   HGBA1C 6.2% 01/03/2017   HGBA1C 6.2 10/10/2016   HGBA1C 6.4 (H) 06/11/2016   Pt is on a regimen of: - Glipizide 10 >> 5 mg 2x a day before b'fast and at bedtime (!) (decreased 09/2016) >> Glipizide ER 5 mg before breakfast (changed 12/2016) She was on Metformin >> stomach pain and diarrhea  Pt checks her sugars 2-3 times a day: - am: 79, 80, 94-125, 155, 233 >> 93-188, 201 - 2h after b'fast: 230 - before lunch: 92 - 2h after lunch: n/c - before dinner: n/c - 2h after dinner: 114-132, 177 >> n/c - bedtime: n/c - nighttime: n/c Lowest sugar was 39 and 57 (06/2016) >> 93; she has hypoglycemia awareness in the 70s.  Highest sugar was 233 >> 201.  She still describes that she gets hot approximately 1 hour after she eats but sugars are not low at that time.    Glucometer: [employer]  Pt's meals are: - Breakfast: 3 slices [place] bacon, 2 eggs, cheese, spinack - Lunch: salad - Dinner: chicken, [place] burger, 2 veggies - Snacks: 2-3 x a day: fruit or nuts  -+ Mild CKD, last BUN/creatinine:  Lab Results  Component Value Date   BUN 21 02/25/2017   BUN 18 06/10/2016   CREATININE 0.88 02/25/2017   CREATININE 1.00 06/10/2016  On lisinopril 20 mg daily -+ HL; last set of lipids: Lab Results  Component Value Date   CHOL 160 02/25/2017   HDL 56 02/25/2017   LDLCALC 81 02/25/2017   TRIG 114 02/25/2017   CHOLHDL 2.9 02/25/2017  On Livalo 2 mg daily - last eye exam was on 03/28/2017: No DR. + glaucoma. Will have laser surgery. After this, she will have cataracts sx.  -She  has numbness and tingling in her feet and hands  She also has a history of sleep apnea, glaucoma, fibromyalgia, PCOS, esophageal stricture (stretched 2012),  HTN.  ROS: Constitutional: no weight gain/no weight loss, + fatigue, no subjective hyperthermia, no subjective hypothermia, + nocturia Eyes: no blurry vision, no xerophthalmia ENT: no sore throat, no nodules palpated in throat, + dysphagia, no odynophagia, no hoarseness Cardiovascular: no CP/no SOB/no palpitations/no leg swelling Respiratory: no cough/no SOB/no wheezing Gastrointestinal: no N/no V/no Rosario/no C/+ acid reflux Musculoskeletal: + muscle aches/no joint aches Skin: no rashes, no hair loss Neurological: no tremors/+ numbness/+ tingling/no dizziness, + HA  I reviewed pt's medications, allergies, PMH, social hx, family hx, and changes were documented in the history of present illness. Otherwise, unchanged from my initial visit note. She stopped Omeprazole, Flexeril,Baclofen, Lorazepam. On Amoxicillin for URI.  Past Medical History:  Diagnosis Date  . Diabetes mellitus without complication ([place])   . Fibromyalgia   . Headache   . Hearing loss   . Hyperlipidemia   . Hypertension   . Stroke [facility])    Past Surgical History:  Procedure Laterality Date  . APPENDECTOMY  1982  . CHOLECYSTECTOMY  1982  . SHUNT REPLACEMENT  2016  . SHUNT REVISION  2018  . STAPEDES SURGERY Bilateral 2007  . TONSILLECTOMY  1974  . VENTRICULOPERITONEAL SHUNT  10/2015   Social History   Social History  . Marital status: Married    Spouse name: N/A  . Number of children: 3   Occupational History  . Unemployed    Social History Main Topics  . Smoking status: Never Smoker  . Smokeless tobacco: Never Used  . Alcohol use No  . Drug use: No   Current Outpatient Medications on File Prior to Visit  Medication Sig Dispense Refill  . amLODipine (NORVASC) 5 MG tablet 2 tablets by mouth daily, to equal 67m/daily 180 tablet 1  .  amoxicillin-clavulanate (AUGMENTIN) 875-125 MG tablet Take 1 tablet by mouth 2 (two) times daily. 20 tablet 0  . ASPIRIN 81 PO Take by mouth.    .Marland Kitchenatorvastatin (LIPITOR) 10 MG tablet Take 1 tablet (10 mg total) by mouth daily. 90 tablet 3  . BAYER MICROLET LANCETS lancets Use to test sugar 1-2 times daily 100 each 1  . Blood Glucose Monitoring Suppl (ONE TOUCH ULTRA 2) w/Device KIT Check glucose 1-2 times daily 1 each 0  . brimonidine (ALPHAGAN) 0.15 % ophthalmic solution Place 1 drop into both eyes 3 (three) times daily.    . clobetasol ointment (TEMOVATE) 09.70% Apply 1 application topically 2 (two) times daily.    . cyclobenzaprine (FLEXERIL) 10 MG tablet Take 10 mg by mouth 3 (three) times daily as needed for muscle spasms.    . fluticasone (FLONASE) 50 MCG/ACT nasal spray Place 2 sprays into both nostrils daily. 16 g 6  . glipiZIDE (GLUCOTROL XL) 5 MG 24 hr tablet Take 1 tablet (5 mg total) by mouth daily with breakfast. 90 tablet 3  . glucose blood (ONE TOUCH ULTRA TEST) test strip Use to test daily with One Touch Meter 100 each 1  . ketoconazole (NIZORAL) 2 % cream Apply topically.    . latanoprost (XALATAN) 0.005 % ophthalmic solution Place 1 drop into both eyes at bedtime.    . lidocaine (LMX) 4 % cream Apply 1 application topically daily as needed.    .Marland Kitchenlisinopril-hydrochlorothiazide (PRINZIDE,ZESTORETIC) 20-25 MG tablet Take 1 tablet by mouth daily. 90 tablet 3  . LORazepam (ATIVAN) 1 MG tablet Take 1 tablet (1 mg total) by mouth 2 (two) times daily as needed for anxiety. 30 tablet 0  . nystatin cream (MYCOSTATIN) Apply 1 application topically 2 (two) times daily as needed for dry skin.    .Marland Kitchenomeprazole (PRILOSEC) 40 MG capsule Take 1 capsule (40 mg total) by mouth 2 (two) times daily. 180 capsule 1  . timolol (BETIMOL) 0.5 % ophthalmic solution Place 1 drop into both eyes 2 (two) times daily.    .Marland Kitchentopiramate (TOPAMAX) 50 MG tablet Take 1 tablet (50 mg total) by mouth 3 (three) times  daily as needed. 270 tablet 1  . Vitamin Rosario, Ergocalciferol, (DRISDOL) 50000 units CAPS capsule Take 1 capsule (50,000 Units total) by mouth every 7 (seven) days. 16 capsule 0   No current facility-administered medications on file prior to visit.    Allergies  Allergen Reactions  . Hydromorphone Hcl Nausea And Vomiting  . Lovastatin Other (See Comments)    myalgia  . Nsaids    Family History  Problem Relation Age of Onset  . Hypertension Mother   . Alzheimer's disease Mother   . Lung cancer Father   . Hypertension Father   . Hypertension Sister   . Alcoholism Brother   . Hypertension Brother   . Diabetes Brother   .  Stroke Brother   . Heart attack Maternal Grandfather   . Heart attack Paternal Grandfather   . Hypertension Sister     PE: BP 126/88   Pulse 74   Ht 5' 2" (1.575 m)   Wt 219 lb 3.2 oz (99.4 kg)   LMP 08/24/1985   SpO2 95%   BMI 40.09 kg/m  Wt Readings from Last 3 Encounters:  04/03/17 219 lb 3.2 oz (99.4 kg)  02/25/17 214 lb 3.2 oz (97.2 kg)  01/03/17 212 lb (96.2 kg)   Constitutional: obese, in NAD Eyes: PERRLA, EOMI, no exophthalmos ENT: moist mucous membranes, no thyromegaly, no cervical lymphadenopathy Cardiovascular: RRR, No MRG Respiratory: CTA B Gastrointestinal: abdomen soft, NT, ND, BS+ Musculoskeletal: no deformities, strength intact in all 4 Skin: moist, warm, no rashes Neurological: no tremor with outstretched hands, DTR normal in all 4  ASSESSMENT: 1. DM2, non-insulin-dependent, uncontrolled (as she has low CBGs), with complications - CVA - RMCA stroke - PN - CKD  2. HL  3. Yeast vaginitis  4. PN  PLAN:  1. Patient with long-standing, uncontrolled, diabetes, on oral antidiabetic regimen now with glipizide ER low dose.  At last visit, she was on glipizide IR with occasional hyperglycemic spikes and hypoglycemic episodes.  Unfortunately, she had no insurance and we could not use other diabetes medicines except Tier 1.  Therefore,  I changed her to glipizide ER and decrease the dose to 5 mg daily, which is less likely to cause hyperglycemic spikes.  We discussed about starting a DPP 4 inhibitor, however, the cheapest was alogliptin at $96 per month at [facility]  and she could not afford that. - sugars are worse in am 2/2 dietary indiscretions >> also gained 7 lbs since last visit >> discussed the need to improve diet - will also try to add Januvia - discussed to start checking sugars later in the day also, rotating check times - I suggested to:  Patient Instructions  Please continue Glipizide ER 5 mg before b'fast.  Add Januvia 100 mg before b'fast.  Can try the following combination for neuropathy: - alpha-lipoic acid 600 mg 2x a day - B complex 1 tablet a day  Please return in 3 months with your sugar log.   - today, HbA1c is 7.4% (higher) - continue checking sugars at different times of the day - check 1-2x a day, rotating checks - advised for yearly eye exams >> she is UTD - Return to clinic in 4 mo with sugar log   2. HL - reviewed latest Lipid panel from 02/2017 >> at goal - continue Livalo >> no SEs   3. Yeast vaginitis - start Diflucan  4. PN - worse - suggested Alpha lipoic acid + B complex  Philemon Kingdom, MD PhD [facility] Endocrinology

## 2017-04-03 NOTE — Addendum Note (Signed)
Addended by: Yolande JollyLAWSON, Nuriyah Hanline on: 04/03/2017 01:21 PM   Modules accepted: Orders

## 2017-04-09 DIAGNOSIS — I10 Essential (primary) hypertension: Secondary | ICD-10-CM | POA: Diagnosis not present

## 2017-04-09 DIAGNOSIS — Z9989 Dependence on other enabling machines and devices: Secondary | ICD-10-CM | POA: Diagnosis not present

## 2017-04-09 DIAGNOSIS — H401132 Primary open-angle glaucoma, bilateral, moderate stage: Secondary | ICD-10-CM | POA: Diagnosis not present

## 2017-04-09 DIAGNOSIS — K219 Gastro-esophageal reflux disease without esophagitis: Secondary | ICD-10-CM | POA: Diagnosis not present

## 2017-04-09 DIAGNOSIS — H40011 Open angle with borderline findings, low risk, right eye: Secondary | ICD-10-CM | POA: Diagnosis not present

## 2017-04-09 DIAGNOSIS — R69 Illness, unspecified: Secondary | ICD-10-CM | POA: Diagnosis not present

## 2017-04-09 DIAGNOSIS — Z9049 Acquired absence of other specified parts of digestive tract: Secondary | ICD-10-CM | POA: Diagnosis not present

## 2017-04-09 DIAGNOSIS — E785 Hyperlipidemia, unspecified: Secondary | ICD-10-CM | POA: Diagnosis not present

## 2017-04-09 DIAGNOSIS — G4733 Obstructive sleep apnea (adult) (pediatric): Secondary | ICD-10-CM | POA: Diagnosis not present

## 2017-04-09 DIAGNOSIS — M797 Fibromyalgia: Secondary | ICD-10-CM | POA: Diagnosis not present

## 2017-04-15 ENCOUNTER — Ambulatory Visit (INDEPENDENT_AMBULATORY_CARE_PROVIDER_SITE_OTHER): Payer: Medicare HMO | Admitting: Adult Health

## 2017-04-15 ENCOUNTER — Encounter: Payer: Self-pay | Admitting: Adult Health

## 2017-04-15 ENCOUNTER — Encounter: Payer: Self-pay | Admitting: Gastroenterology

## 2017-04-15 VITALS — BP 138/81 | HR 63 | Temp 97.7°F | Ht 62.0 in | Wt 220.5 lb

## 2017-04-15 DIAGNOSIS — R1013 Epigastric pain: Secondary | ICD-10-CM

## 2017-04-15 DIAGNOSIS — K219 Gastro-esophageal reflux disease without esophagitis: Secondary | ICD-10-CM

## 2017-04-15 DIAGNOSIS — R0789 Other chest pain: Secondary | ICD-10-CM | POA: Diagnosis not present

## 2017-04-15 IMAGING — CT CT HEAD CODE STROKE
3 series · 15 of 47 positions shown, 18 images · non-contrast
Comparison: None.

CLINICAL DATA: Code stroke. Left-sided weakness. Right facial
droop. Slurred speech. Last seen normal 90 minutes ago.

EXAM:
CT HEAD WITHOUT CONTRAST
TECHNIQUE: Contiguous axial images were obtained from the base of the skull
through the vertex without intravenous contrast.

[Series 3: head 5.0 st · axial · 0.42mm/px · z∈[-85,+55]mm · 9 of 34 slices shown, 12 images]
[im 3/34  brain]
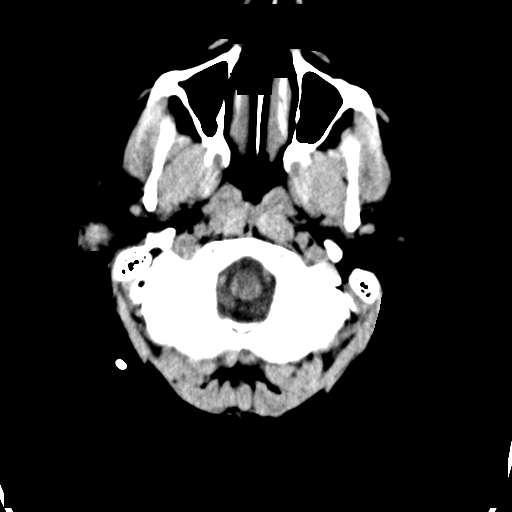
[im 3/34  bone]
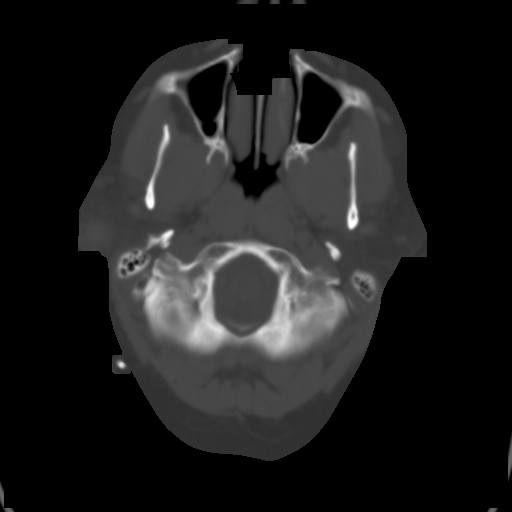
[im 6/34  brain]
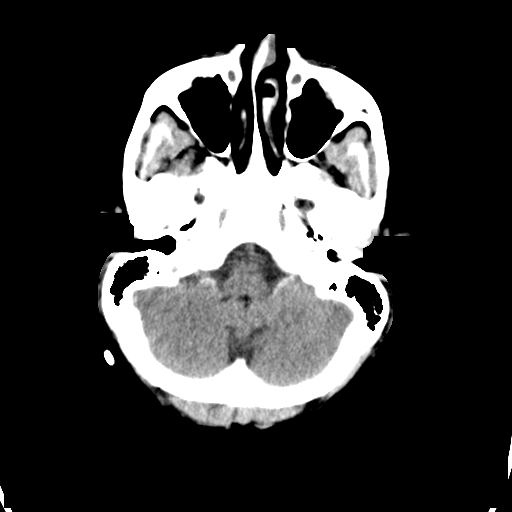
[im 10/34  brain]
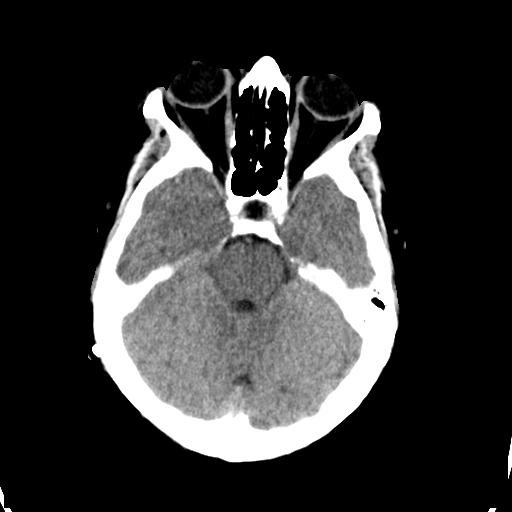
[im 13/34  brain]
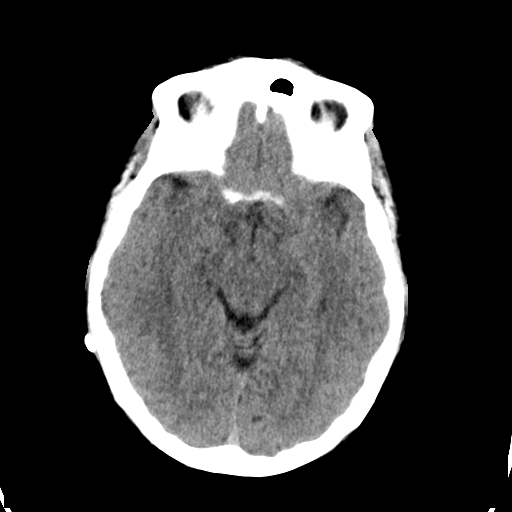
[im 18/34  brain]
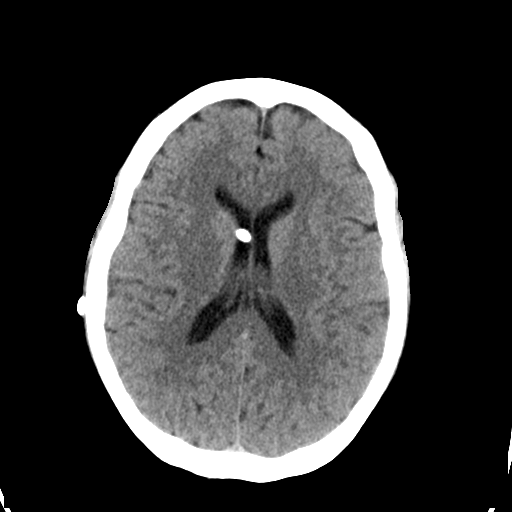
[im 18/34  bone]
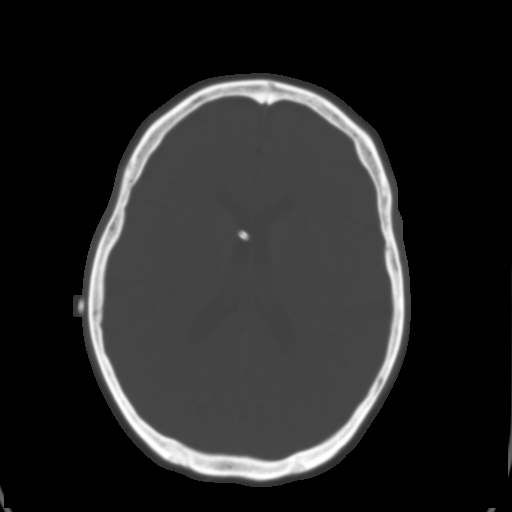
[im 21/34  brain]
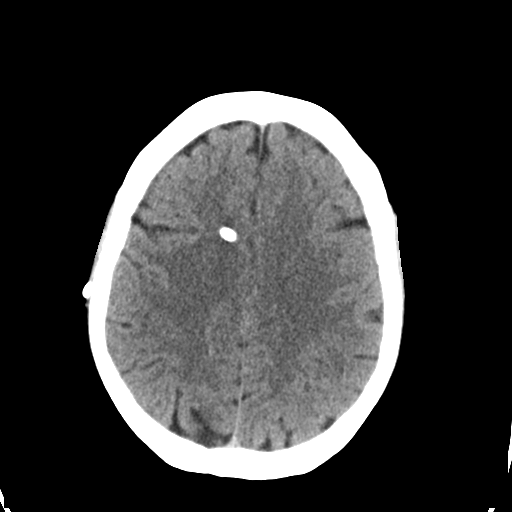
[im 24/34  brain]
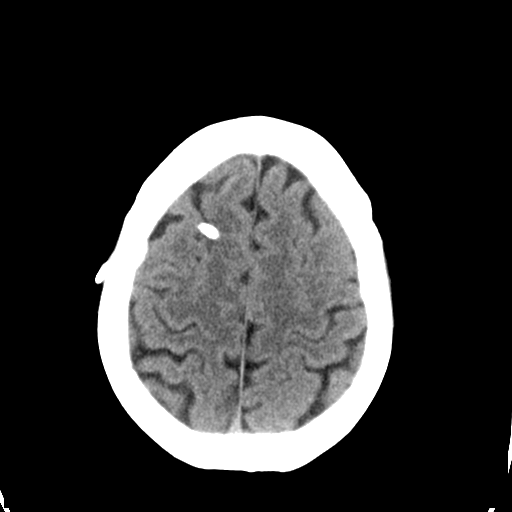
[im 28/34  brain]
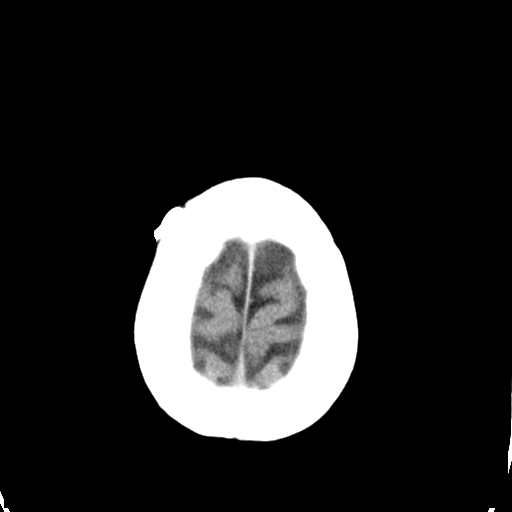
[im 31/34  brain]
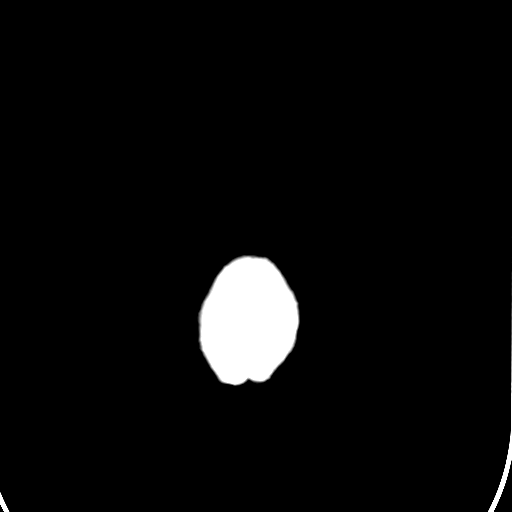
[im 31/34  bone]
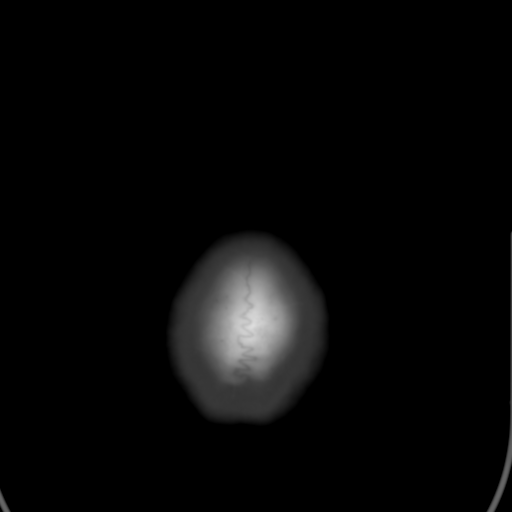

[Series 5: head 3.0 cor st · coronal · 0.30mm/px · 3 of 67 slices shown]
[im 23/67  brain]
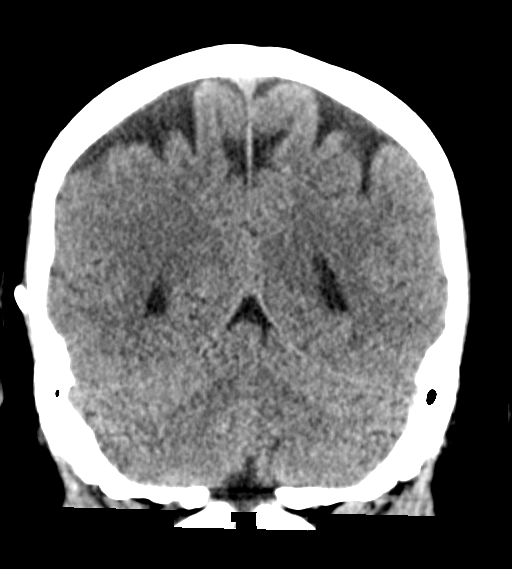
[im 30/67  brain]
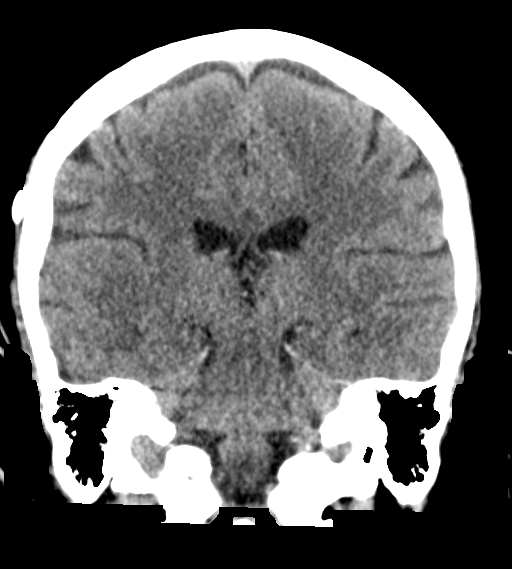
[im 37/67  brain]
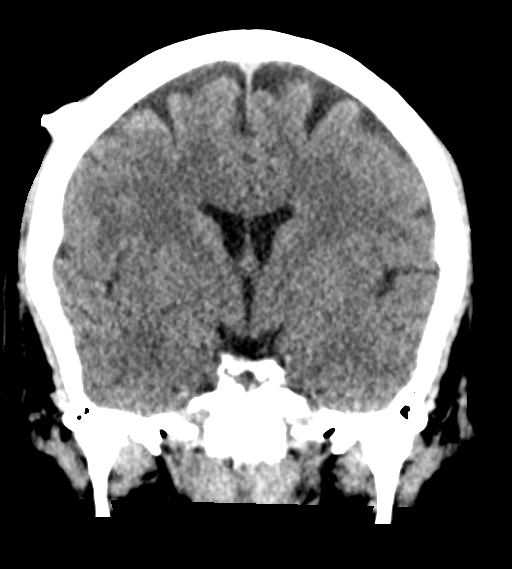

[Series 6: head 3.0 sag st · sagittal · 0.33mm/px · 3 of 67 slices shown]
[im 23/67  brain]
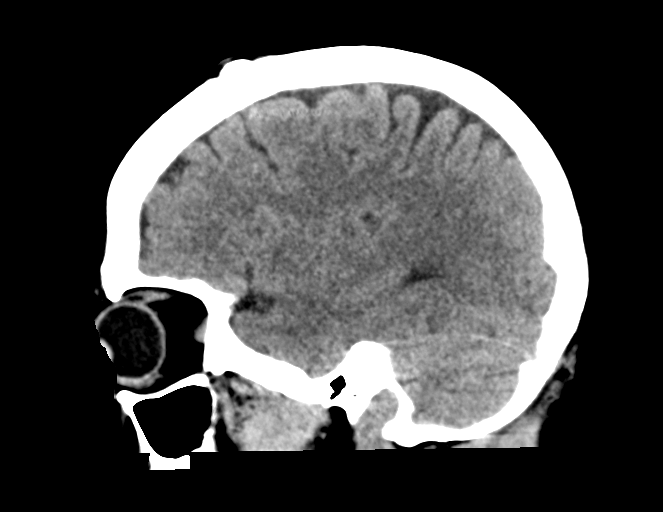
[im 34/67  brain]
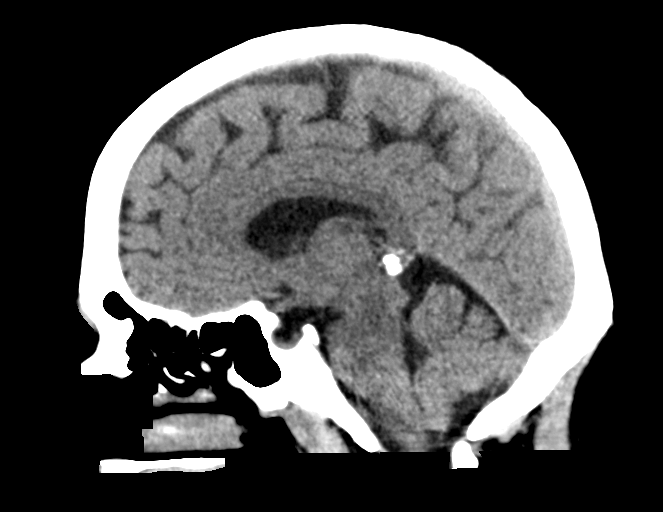
[im 45/67  brain]
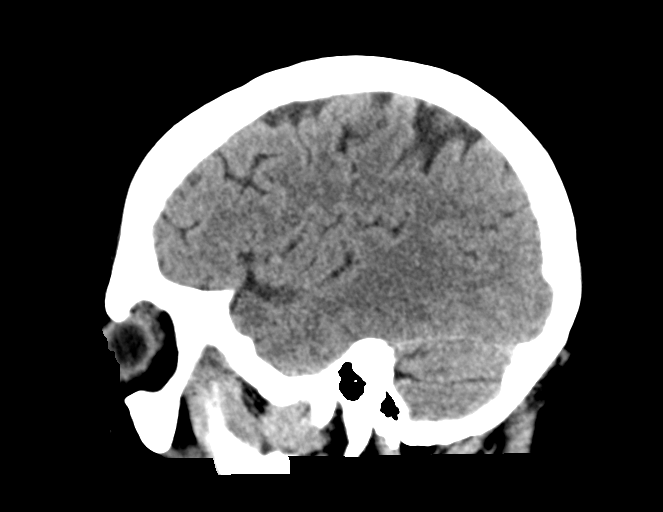

[15 of 47 positions shown; findings below may reference images not displayed]

FINDINGS: Brain: No sign of acute infarction. No mass lesion, hemorrhage,
hydrocephalus or extra-axial collection. VP shunt enters from a
right frontal approach.

Vascular: No abnormal vascular finding.

Skull: Negative

Sinuses/Orbits: Clear/normal

Other: None

ASPECTS (Alberta Stroke Program Early CT Score)

- Ganglionic level infarction (caudate, lentiform nuclei, internal
capsule, insula, M1-M3 cortex): 7

- Supraganglionic infarction (M4-M6 cortex): 3

Total score (0-10 with 10 being normal): 10
IMPRESSION: 1. No acute finding.  Right VP shunt without hydrocephalus.
2. ASPECTS is 10.

These results were called by telephone at the time of interpretation
on 06/10/2016 at [DATE] to Dr. Gopcevic, who verbally acknowledged
these results.

## 2017-04-15 MED ORDER — OMEPRAZOLE 40 MG PO CPDR: 40.0000 mg | DELAYED_RELEASE_CAPSULE | Freq: Two times a day (BID) | ORAL | 1 refills | Status: AC

## 2017-04-15 MED ORDER — ONDANSETRON 8 MG PO TBDP: 8.0000 mg | ORAL_TABLET | Freq: Three times a day (TID) | ORAL | 0 refills | Status: AC | PRN

## 2017-04-15 NOTE — Progress Notes (Signed)
Subjective:    Patient ID: Melissa Rosario, female    DOB: November 05, 1960, 57 y.o.   MRN: [id number]  HPI:  Melissa Rosario presents with mid sternal chest pain, with  diffuse abdominal pain that started 5-6 weeks ago.  She reports being awoken from sleep last Thursday at 0430 in am due to intense sternal pain that radiated to back, vomited twice and had one episode of diarrhea.   She reports sternal CP was initially intermittent but is now a constant, gnawing pain that rated 5/10. She denies travel outside the [place]. She denies fever/night sweats/change in appetite/unexplained wt loss. She had "eye surgery to relieve pressure" last Tuesday and stayed with her daughter and 63 month granddaughter after the procedure. She denies eating anywhere unusual wed prior to N/V/increase in pain. She has sig GI hx- GERD, gastric ulcers, last contact with GI specialist >5 years ago. She has had cholesctomy and appendectomy Jan 1982 She reports normal urination/BMs and denies blood in urine/stool. She has had intermittent   Of note- She has been off Omeprazole for a few weeks. Discussed reducing dosage Omeprazole 33m BID in Nov 2018 due to her being on Celexa. However pt reported that she was not taking Celexa and dosage not decreased.  She was seen by her Endocrinologist 04/03/17-  "Please continue Glipizide ER 5 mg before b'fast. Add Januvia 100 mg before b'fast Can try the following combination for neuropathy: - alpha-lipoic acid 600 mg 2x a day - B complex 1 tablet a day - today, HbA1c is 7.4% (higher) - continue checking sugars at different times of the day - check 1-2x a day, rotating checks Instructed to f/u in 3 months"   Patient Care Team    Relationship Specialty Notifications Start End  DMina MarbleD, NP PCP - General Family Medicine  10/10/16   GRuthy Dick MD Referring Physician Ophthalmology  10/10/16   KBeckey Downing, MD Referring Physician Cardiology  10/10/16   JCaryl Comes MD  Physician Assistant Neurosurgery  10/10/16     Patient Active Problem List   Diagnosis Date Noted  . Chest pain, mid sternal 04/15/2017  . Epigastric pain 04/15/2017  . Gastroesophageal reflux disease 04/15/2017  . Yeast vaginitis 04/03/2017  . Diabetic peripheral neuropathy associated with type 2 diabetes mellitus ([place] 04/03/2017  . Healthcare maintenance 02/25/2017  . Rash 02/25/2017  . Other chronic pain 11/28/2016  . Fatigue 11/28/2016  . Benign essential HTN 11/28/2016  . Bronchitis 11/28/2016  . HTN, goal below 140/90 10/10/2016  . Hypertensive urgency 06/12/2016  . Hyperlipidemia 06/12/2016  . Poorly controlled type 2 diabetes mellitus with circulatory disorder ([place] 06/12/2016  . Morbid obesity ([place] 06/12/2016  . Hydrocephalus with operating shunt 06/12/2016  . Lip edema 06/12/2016  . Anxiety 06/12/2016  . [place]-like episode ([place] brain s/p IV tPA 06/10/2016  . S/P VP shunt 03/22/2015  . Abnormal ultrasound of abdomen 03/15/2015  . IIH (idiopathic intracranial hypertension) 11/05/2014  . Other abnormal glucose 12/14/2009     Past Medical History:  Diagnosis Date  . Diabetes mellitus without complication ([place]   . Fibromyalgia   . Headache   . Hearing loss   . Hyperlipidemia   . Hypertension   . Stroke ([facility]      Past Surgical History:  Procedure Laterality Date  . APPENDECTOMY  1982  . CHOLECYSTECTOMY  1982  . EYE SURGERY Right 04/09/2017   drain fluid from eye  . SHUNT REPLACEMENT  2016  . SHUNT  REVISION  2018  . STAPEDES SURGERY Bilateral 2007  . TONSILLECTOMY  1974  . VENTRICULOPERITONEAL SHUNT  10/2015     Family History  Problem Relation Age of Onset  . Hypertension Mother   . Alzheimer's disease Mother   . Lung cancer Father   . Hypertension Father   . Hypertension Sister   . Alcoholism Brother   . Hypertension Brother   . Diabetes Brother   . Stroke Brother   . Heart attack Maternal Grandfather   . Heart attack Paternal  Grandfather   . Hypertension Sister      Social History   Substance and Sexual Activity  Drug Use No     Social History   Substance and Sexual Activity  Alcohol Use No     Social History   Tobacco Use  Smoking Status Never Smoker  Smokeless Tobacco Never Used     Outpatient Encounter Medications as of 04/15/2017  Medication Sig  . amLODipine (NORVASC) 5 MG tablet 2 tablets by mouth daily, to equal 71m/daily  . ASPIRIN 81 PO Take by mouth.  .Marland Kitchenatorvastatin (LIPITOR) 10 MG tablet Take 1 tablet (10 mg total) by mouth daily.  .Marland KitchenBAYER MICROLET LANCETS lancets Use to test sugar 1-2 times daily  . Blood Glucose Monitoring Suppl (ONE TOUCH ULTRA 2) w/Device KIT Check glucose 1-2 times daily  . brimonidine (ALPHAGAN) 0.15 % ophthalmic solution Place 1 drop into both eyes 3 (three) times daily.  . clobetasol ointment (TEMOVATE) 03.35% Apply 1 application topically 2 (two) times daily.  .Marland KitchenglipiZIDE (GLUCOTROL XL) 5 MG 24 hr tablet Take 1 tablet (5 mg total) by mouth daily with breakfast.  . glucose blood (ONE TOUCH ULTRA TEST) test strip Use to test daily with One Touch Meter  . latanoprost (XALATAN) 0.005 % ophthalmic solution Place 1 drop into both eyes at bedtime.  .Marland Kitchenlisinopril-hydrochlorothiazide (PRINZIDE,ZESTORETIC) 20-25 MG tablet Take 1 tablet by mouth daily.  . sitaGLIPtin (JANUVIA) 100 MG tablet Take 1 tablet (100 mg total) by mouth daily.  . timolol (BETIMOL) 0.5 % ophthalmic solution Place 1 drop into both eyes 2 (two) times daily.  .Marland Kitchentopiramate (TOPAMAX) 50 MG tablet Take 1 tablet (50 mg total) by mouth 3 (three) times daily as needed.  . Vitamin D, Ergocalciferol, (DRISDOL) 50000 units CAPS capsule Take 1 capsule (50,000 Units total) by mouth every 7 (seven) days.  .Marland Kitchenomeprazole (PRILOSEC) 40 MG capsule Take 1 capsule (40 mg total) by mouth 2 (two) times daily.  . ondansetron (ZOFRAN-ODT) 8 MG disintegrating tablet Take 1 tablet (8 mg total) by mouth every 8 (eight)  hours as needed for nausea or vomiting.  . [DISCONTINUED] fluticasone (FLONASE) 50 MCG/ACT nasal spray Place 2 sprays into both nostrils daily.  . [DISCONTINUED] ketoconazole (NIZORAL) 2 % cream Apply topically.  . [DISCONTINUED] lidocaine (LMX) 4 % cream Apply 1 application topically daily as needed.  . [DISCONTINUED] LORazepam (ATIVAN) 1 MG tablet Take 1 tablet (1 mg total) by mouth 2 (two) times daily as needed for anxiety.  . [DISCONTINUED] nystatin cream (MYCOSTATIN) Apply 1 application topically 2 (two) times daily as needed for dry skin.  . [DISCONTINUED] omeprazole (PRILOSEC) 40 MG capsule Take 1 capsule (40 mg total) by mouth 2 (two) times daily.   No facility-administered encounter medications on file as of 04/15/2017.    Allergies: Hydromorphone hcl; Lovastatin; and Nsaids  Body mass index is 40.32 kg/m.  Blood pressure 138/81, pulse 63, temperature 97.7 F (36.5 C), temperature  source Oral, height _0  (1.575 m), weight 220 lb 7.5 oz (100 kg), last menstrual period 08/24/1985, SpO2 100 %.  Review of Systems  Constitutional: Positive for fatigue. Negative for activity change, appetite change, chills, diaphoresis, fever and unexpected weight change.  Respiratory: Negative for cough, chest tightness, shortness of breath, wheezing and stridor.   Cardiovascular: Positive for chest pain. Negative for palpitations and leg swelling.  Gastrointestinal: Positive for abdominal pain and nausea. Negative for abdominal distention, blood in stool, constipation, diarrhea and vomiting.  Genitourinary: Negative for difficulty urinating, flank pain and hematuria.  Musculoskeletal: Negative for arthralgias and back pain.  Neurological: Negative for dizziness.  Hematological: Does not bruise/bleed easily.       Objective:   Physical Exam  Constitutional: She appears well-developed and well-nourished. No distress.  HENT:  Head: Normocephalic and atraumatic.  Right Ear: External ear normal.   Left Ear: External ear normal.  Cardiovascular: Normal rate, regular rhythm, normal heart sounds and intact distal pulses.  No murmur heard. Pulmonary/Chest: Effort normal and breath sounds normal. No respiratory distress. She has no wheezes. She has no rales. She exhibits no tenderness.  Abdominal: Soft. Bowel sounds are normal. She exhibits no shifting dullness, no distension and no mass. There is tenderness in the right upper quadrant and epigastric area. There is no rebound, no guarding, no CVA tenderness, no tenderness at McBurney's point and negative Murphy's sign.  Skin: Skin is warm and dry. No rash noted. She is not diaphoretic. No erythema. No pallor.  Psychiatric: She has a normal mood and affect. Her behavior is normal. Judgment and thought content normal.  Nursing note and vitals reviewed.         Assessment & Plan:   1. Chest pain, mid sternal   2. Epigastric pain   3. Gastroesophageal reflux disease, esophagitis presence not specified     Gastroesophageal reflux disease Refilled Omeprazole 14m BID- she has been out for weeks. Instructed to call pharmacy if she needs RF in future. Avoid spicy/acidic food and eating too close to bedtime. GI referral placed.   Chest pain, mid sternal EKG- NSR  Epigastric pain Unable to obtain CBC today, please return in am for lab draw. [place] ordered GI referral placed.  Zofran sent in and refilled Omeprazole      FOLLOW-UP:  Return if symptoms worsen or fail to improve.

## 2017-04-15 NOTE — Patient Instructions (Signed)
Heartburn Heartburn is a type of pain or discomfort that can happen in the throat or chest. It is often described as a burning pain. It may also cause a bad taste in the mouth. Heartburn may feel worse when you lie down or bend over. It may be caused by stomach contents that move back up (reflux) into the tube that connects the mouth with the stomach (esophagus). Follow these instructions at home: Take these actions to lessen your discomfort and to help avoid problems. Diet  Follow a diet as told by your doctor. You may need to avoid foods and drinks such as: ? Coffee and tea (with or without caffeine). ? Drinks that contain alcohol. ? Energy drinks and sports drinks. ? Carbonated drinks or sodas. ? Chocolate and cocoa. ? Peppermint and mint flavorings. ? Garlic and onions. ? Horseradish. ? Spicy and acidic foods, such as peppers, chili powder, curry powder, vinegar, hot sauces, and BBQ sauce. ? Citrus fruit juices and citrus fruits, such as oranges, lemons, and limes. ? Tomato-based foods, such as red sauce, chili, salsa, and pizza with red sauce. ? Fried and fatty foods, such as donuts, french fries, potato chips, and high-fat dressings. ? High-fat meats, such as hot dogs, rib eye steak, sausage, ham, and bacon. ? High-fat dairy items, such as whole milk, butter, and cream cheese.  Eat small meals often. Avoid eating large meals.  Avoid drinking large amounts of liquid with your meals.  Avoid eating meals during the 2-3 hours before bedtime.  Avoid lying down right after you eat.  Do not exercise right after you eat. General instructions  Pay attention to any changes in your symptoms.  Take over-the-counter and prescription medicines only as told by your doctor. Do not take aspirin, ibuprofen, or other NSAIDs unless your doctor says it is okay.  Do not use any tobacco products, including cigarettes, chewing tobacco, and e-cigarettes. If you need help quitting, ask your  doctor.  Wear loose clothes. Do not wear anything tight around your waist.  Raise (elevate) the head of your bed about 6 inches (15 cm).  Try to lower your stress. If you need help doing this, ask your doctor.  If you are overweight, lose an amount of weight that is healthy for you. Ask your doctor about a safe weight loss goal.  Keep all follow-up visits as told by your doctor. This is important. Contact a doctor if:  You have new symptoms.  You lose weight and you do not know why it is happening.  You have trouble swallowing, or it hurts to swallow.  You have wheezing or a cough that keeps happening.  Your symptoms do not get better with treatment.  You have heartburn often for more than two weeks. Get help right away if:  You have pain in your arms, neck, jaw, teeth, or back.  You feel sweaty, dizzy, or light-headed.  You have chest pain or shortness of breath.  You throw up (vomit) and your throw up looks like blood or coffee grounds.  Your poop (stool) is bloody or black. This information is not intended to replace advice given to you by your health care provider. Make sure you discuss any questions you have with your health care provider. Document Released: 11/22/2010 Document Revised: 08/18/2015 Document Reviewed: 07/07/2014 Elsevier Interactive Patient Education  2018 [employer].   Nausea, Adult Nausea is the feeling of an upset stomach or having to vomit. Nausea on its own is not usually a  serious concern, but it may be an early sign of a more serious medical problem. As nausea gets worse, it can lead to vomiting. If vomiting develops, or if you are not able to drink enough fluids, you are at risk of becoming dehydrated. Dehydration can make you tired and thirsty, cause you to have a dry mouth, and decrease how often you urinate. Older adults and people with other diseases or a weak immune system are at higher risk for dehydration. The main goals of treating your  nausea are:  To limit repeated nausea episodes.  To prevent vomiting and dehydration.  Follow these instructions at home: Follow instructions from your health care provider about how to care for yourself at home. Eating and drinking Follow these recommendations as told by your health care provider:  Take an oral rehydration solution (ORS). This is a drink that is sold at pharmacies and retail stores.  Drink clear fluids in small amounts as you are able. Clear fluids include water, ice chips, diluted fruit juice, and low-calorie sports drinks.  Eat bland, easy-to-digest foods in small amounts as you are able. These foods include bananas, applesauce, rice, lean meats, toast, and crackers.  Avoid drinking fluids that contain a lot of sugar or caffeine, such as energy drinks, sports drinks, and soda.  Avoid alcohol.  Avoid spicy or fatty foods.  General instructions  Drink enough fluid to keep your urine clear or pale yellow.  Wash your hands often. If soap and water are not available, use hand sanitizer.  Make sure that all people in your household wash their hands well and often.  Rest at home while you recover.  Take over-the-counter and prescription medicines only as told by your health care provider.  Breathe slowly and deeply when you feel nauseous.  Watch your condition for any changes.  Keep all follow-up visits as told by your health care provider. This is important. Contact a health care provider if:  You have a headache.  You have new symptoms.  Your nausea gets worse.  You have a fever.  You feel light-headed or dizzy.  You vomit.  You cannot keep fluids down. Get help right away if:  You have pain in your chest, neck, arm, or jaw.  You feel extremely weak or you faint.  You have vomit that is bright red or looks like coffee grounds.  You have bloody or black stools or stools that look like tar.  You have a severe headache, a stiff neck, or  both.  You have severe pain, cramping, or bloating in your abdomen.  You have a rash.  You have difficulty breathing or are breathing very quickly.  Your heart is beating very quickly.  Your skin feels cold and clammy.  You feel confused.  You have pain when you urinate.  You have signs of dehydration, such as: ? Dark urine, very little, or no urine. ? Cracked lips. ? Dry mouth. ? Sunken eyes. ? Sleepiness. ? Weakness. These symptoms may represent a serious problem that is an emergency. Do not wait to see if the symptoms will go away. Get medical help right away. Call your local emergency services (911 in the [place] Do not drive yourself to the hospital. This information is not intended to replace advice given to you by your health care provider. Make sure you discuss any questions you have with your health care provider. Document Released: 04/19/2004 Document Revised: 08/15/2015 Document Reviewed: 11/16/2014 Elsevier Interactive Patient Education  2018 [employer]  Inc[employer]  Stay hydrated and sip fluids. Refilled Omeprazole 40mg  twice daily, if you needs refills in future please call your pharmacy. EKG- normal Please return in morning for CBC lab work. Order placed for abdominal ultrasound. Referral to GI specialist placed. Please continue all medications as directed. Please call clinic with any questions/concerns. FEEL BETTER!

## 2017-04-15 NOTE — Assessment & Plan Note (Signed)
Refilled Omeprazole 40mg  BID- she has been out for weeks. Instructed to call pharmacy if she needs RF in future. Avoid spicy/acidic food and eating too close to bedtime. GI referral placed.

## 2017-04-15 NOTE — Assessment & Plan Note (Addendum)
Unable to obtain CBC today, please return in am for lab draw. US abdomen complete ordered GI referral placed.  Zofran sent in and refilled Omeprazole

## 2017-04-15 NOTE — Assessment & Plan Note (Signed)
EKG NSR

## 2017-04-17 ENCOUNTER — Other Ambulatory Visit: Payer: Medicare HMO

## 2017-04-18 ENCOUNTER — Encounter: Payer: Self-pay | Admitting: Nurse Practitioner

## 2017-04-18 ENCOUNTER — Ambulatory Visit: Payer: Medicare HMO | Admitting: Nurse Practitioner

## 2017-04-18 ENCOUNTER — Ambulatory Visit
Admission: RE | Admit: 2017-04-18 | Discharge: 2017-04-18 | Disposition: A | Discharge status: Home / Self Care | Payer: Medicare HMO | Source: Ambulatory Visit | Attending: Adult Health | Admitting: Adult Health

## 2017-04-18 VITALS — BP 143/90 | HR 68 | Wt 221.0 lb

## 2017-04-18 DIAGNOSIS — E785 Hyperlipidemia, unspecified: Secondary | ICD-10-CM | POA: Diagnosis not present

## 2017-04-18 DIAGNOSIS — I10 Essential (primary) hypertension: Secondary | ICD-10-CM | POA: Insufficient documentation

## 2017-04-18 DIAGNOSIS — E1159 Type 2 diabetes mellitus with other circulatory complications: Secondary | ICD-10-CM

## 2017-04-18 DIAGNOSIS — E1165 Type 2 diabetes mellitus with hyperglycemia: Secondary | ICD-10-CM | POA: Diagnosis not present

## 2017-04-18 DIAGNOSIS — G4733 Obstructive sleep apnea (adult) (pediatric): Secondary | ICD-10-CM

## 2017-04-18 DIAGNOSIS — R1013 Epigastric pain: Secondary | ICD-10-CM

## 2017-04-18 DIAGNOSIS — R2990 Unspecified symptoms and signs involving the nervous system: Secondary | ICD-10-CM

## 2017-04-18 DIAGNOSIS — I639 Cerebral infarction, unspecified: Secondary | ICD-10-CM | POA: Diagnosis not present

## 2017-04-18 NOTE — Patient Instructions (Signed)
Stressed the importance of management of risk factors to prevent further stroke Continue aspirin for secondary stroke prevention Maintain strict control of hypertension with blood pressure goal below 130/90, today's reading 143/90 continue antihypertensive medications Control of diabetes with hemoglobin A1c below 6.5 followed by primary care continue diabetic medications patient to see an endocrinologist Cholesterol with LDL cholesterol less than 70, followed by primary care,   continue statin drug Lipitor Continue Topamax for headache/migraine  follow-up with equipment company about CPAP mask Exercise by walking, 30 minutes daily  eat healthy diet with whole grains,  fresh fruits and vegetables F/U in 8 months

## 2017-04-18 NOTE — Progress Notes (Signed)
I agree with the assessment and plan as directed by NP .The patient is not known to me and I was asked to review this note as WID . The Stroke service will follow this patient,   Cc: Dr Roda ShuttersXu and Griselda MinerSethi    Anden Bartolo, MD

## 2017-04-18 NOTE — Progress Notes (Signed)
[facility]  PATIENT: Melissa Rosario DOB: Oct 29, 1960   REASON FOR VISIT: Follow-up for strokelike episodes, March 2018 HISTORY FROM: Patient    HISTORY OF PRESENT ILLNESS:Melissa Rosario is a 50 year Caucasian lady who seen today after hospital admission in March 2018. She is accompanied by husband. History is obtained from the patient, husband, review of electronic medical records and have personally reviewed imaging films.Melissa Adamsis a 57 y.o.femalewith a hx of VP shunt placed 2 years ago. She was last normal at 11am 06/10/2016 ([facility])speaking with family, then suddenly developed left retroorbital pain and left facial numbness and slurred speech. She was rushed to the ED as a code stroke. Dr. Wendee Beavers did not receive a call when the patient arrived at the hospital and only found the pt after her CT had been completed. On his assessment she had an NIHSS = 4 and no contraindications to tPA. He asked emphatically whether her weakness was chronic and she denied this. She also does not take any anticoagulation. He discussed risks and benefits of treatment including the possibility of intracranial bleeding; pt agreed to be treated. She received IV tPA 06/10/2016 at 1244. She was admitted to the neuro ICU for further evaluation and treatment.  She remained neurologically stable and made a full recovery. Blood pressure was tightly controlled. MRI scan of the brain showed no evidence of acute infarct. Right frontal ventricular peritoneal shunt catheter was noted. Ventricular size was not enlarged. MRA of the brain showed no significant intracranial and carotid ultrasound showed no significant extracranial stenosis. Transthoracic echo showed normal ejection fraction without cardiac source of embolism. LDL cholesterol was elevated 127 mg percent. Hemoglobin A1c was 6.4. Patient was started on aspirin for stroke prevention which is tolerating well without bruising or bleeding. She states her  blood pressure well controlled today it is 113/69. She had a follow-up in about an A1c checked a few weeks ago which was satisfactory at 6.2. She is tolerating her statin without muscle aches or pains. The patient has a new complaint today of chronic headaches which she feels started after her strokelike episode. She did have a remote history of migraine headaches but these gradually resolved over the years. She describes the headaches now occurring at a frequency of once per week. She does take baclofen as needed and Topamax therapy times daily which provided relief. She does see a neurologist Dr. Cherly Beach in [place] for headaches. She has currently applied for disability and has no health insurance and hence would not want any further workup or changes in medications at the present time. UPDATE 1/24/2019CM Melissa Rosario, 57 year old female returns for follow-up with history of strokelike symptoms in March 2018.  She received IV TPA.  MRI with no evidence of acute stroke. MRA of the brain showed no significant intracranial and carotid ultrasound showed no significant extracranial stenosis. Transthoracic echo showed normal ejection fraction without cardiac source of embolism.  She remains on aspirin for secondary stroke prevention without further stroke or TIA symptoms.  She is on Lipitor without myalgias.  Labs are followed care.  Diabetes has not been well controlled, CBGs 140 and Up.  She is due to see an endocrinologist.  She has obstructive sleep apnea but does not use her CPAP because  mask does not fit.  She just had a laser on her right eye, she is due to have the left eye done soon.  She also has bilateral cataracts.  Her headaches are controlled on Topamax.  She has about 1 headache per week.  Blood pressure in the office today 143/90, she has not taken her blood pressure medicine this morning.  Currently is having a lot of abdominal pain and is having a CT later this afternoon.  She returns for  reevaluation REVIEW OF SYSTEMS: Full 14 system review of systems performed and notable only for those listed, all others are neg:  Constitutional: Fatigue Cardiovascular: neg Ear/Nose/Throat: neg  Skin: neg Eyes: Light sensitivity Respiratory: neg Gastroitestinal: Abdominal pain Hematology/Lymphatic: neg  Endocrine: neg Musculoskeletal:neg Allergy/Immunology: neg Neurological: Headache Psychiatric: neg Sleep : neg   ALLERGIES: Allergies  Allergen Reactions  . Hydromorphone Hcl Nausea And Vomiting  . Lovastatin Other (See Comments)    myalgia  . Nsaids     HOME MEDICATIONS: Outpatient Medications Prior to Visit  Medication Sig Dispense Refill  . amLODipine (NORVASC) 5 MG tablet 2 tablets by mouth daily, to equal 8m/daily 180 tablet 1  . ASPIRIN 81 PO Take by mouth.    .Marland Kitchenatorvastatin (LIPITOR) 10 MG tablet Take 1 tablet (10 mg total) by mouth daily. 90 tablet 3  . BAYER MICROLET LANCETS lancets Use to test sugar 1-2 times daily 100 each 1  . Blood Glucose Monitoring Suppl (ONE TOUCH ULTRA 2) w/Device KIT Check glucose 1-2 times daily 1 each 0  . brimonidine (ALPHAGAN) 0.15 % ophthalmic solution Place 1 drop into both eyes 3 (three) times daily.    . clobetasol ointment (TEMOVATE) 00.93% Apply 1 application topically 2 (two) times daily.    .Marland KitchenglipiZIDE (GLUCOTROL XL) 5 MG 24 hr tablet Take 1 tablet (5 mg total) by mouth daily with breakfast. 90 tablet 3  . glucose blood (ONE TOUCH ULTRA TEST) test strip Use to test daily with One Touch Meter 100 each 1  . latanoprost (XALATAN) 0.005 % ophthalmic solution Place 1 drop into both eyes at bedtime.    .Marland Kitchenlisinopril-hydrochlorothiazide (PRINZIDE,ZESTORETIC) 20-25 MG tablet Take 1 tablet by mouth daily. 90 tablet 3  . omeprazole (PRILOSEC) 40 MG capsule Take 1 capsule (40 mg total) by mouth 2 (two) times daily. 180 capsule 1  . ondansetron (ZOFRAN-ODT) 8 MG disintegrating tablet Take 1 tablet (8 mg total) by mouth every 8 (eight)  hours as needed for nausea or vomiting. 20 tablet 0  . sitaGLIPtin (JANUVIA) 100 MG tablet Take 1 tablet (100 mg total) by mouth daily. 30 tablet 5  . timolol (BETIMOL) 0.5 % ophthalmic solution Place 1 drop into both eyes 2 (two) times daily.    .Marland Kitchentopiramate (TOPAMAX) 50 MG tablet Take 1 tablet (50 mg total) by mouth 3 (three) times daily as needed. 270 tablet 1  . Vitamin D, Ergocalciferol, (DRISDOL) 50000 units CAPS capsule Take 1 capsule (50,000 Units total) by mouth every 7 (seven) days. 16 capsule 0   No facility-administered medications prior to visit.     PAST MEDICAL HISTORY: Past Medical History:  Diagnosis Date  . [place]   . Fibromyalgia   . Headache   . Hearing loss   . Hyperlipidemia   . Hypertension   . Stroke ([facility]     PAST SURGICAL HISTORY: Past Surgical History:  Procedure Laterality Date  . APPENDECTOMY  1982  . CHOLECYSTECTOMY  1982  . EYE SURGERY Right 04/09/2017   drain fluid from eye  . SHUNT REPLACEMENT  2016  . SHUNT REVISION  2018  . STAPEDES SURGERY Bilateral 2007  . TONSILLECTOMY  1974  . VENTRICULOPERITONEAL SHUNT  10/2015    FAMILY HISTORY: Family History  Problem Relation Age of Onset  . Hypertension Mother   . Alzheimer's disease Mother   . Lung cancer Father   . Hypertension Father   . Hypertension Sister   . Alcoholism Brother   . Hypertension Brother   . Diabetes Brother   . Stroke Brother   . Heart attack Maternal Grandfather   . Heart attack Paternal Grandfather   . Hypertension Sister     SOCIAL HISTORY: Social History   Socioeconomic History  . Marital status: Married    Spouse name: Not on file  . Number of children: 3  . Years of education: Not on file  . Highest education level: Not on file  Social Needs  . Financial resource strain: Not on file  . Food insecurity - worry: Not on file  . Food insecurity - inability: Not on file  . Transportation needs - medical: Not on file    . Transportation needs - non-medical: Not on file  Occupational History  . Occupation: Unemployed  Tobacco Use  . Smoking status: Never Smoker  . Smokeless tobacco: Never Used  Substance and Sexual Activity  . Alcohol use: No  . Drug use: No  . Sexual activity: Yes    Partners: Male    Birth control/protection: None  Other Topics Concern  . Not on file  Social History Narrative  . Not on file     PHYSICAL EXAM  Vitals:   04/18/17 1048  BP: (!) 143/90  Pulse: 68  Weight: 221 lb (100.2 kg)   Body mass index is 40.42 kg/m.  Generalized: Well developed, morbidly obese female in no acute distress  Head: normocephalic and atraumatic,. Oropharynx benign  Neck: Supple, no carotid bruits  Cardiac: Regular rate rhythm, no murmur  Musculoskeletal: No deformity   Neurological examination   Mentation: Alert oriented to time, place, history taking. Attention span and concentration appropriate. Recent and remote memory intact.  Follows all commands speech and language fluent.   Cranial nerve II-XII: Pupils were equal round reactive to light extraocular movements were full, visual field were full on confrontational test. Facial sensation and strength were normal. hearing was intact to finger rubbing bilaterally. Uvula tongue midline. head turning and shoulder shrug were normal and symmetric.Tongue protrusion into cheek strength was normal. Motor: normal bulk and tone, full strength in the BUE, BLE, fine finger movements normal, no pronator drift. No focal weakness Sensory: normal and symmetric to light touch, pinprick, and  Vibration, in the upper and lower extremities Coordination: finger-nose-finger, heel-to-shin bilaterally, no dysmetria, no tremor Reflexes: 1+ upper lower and symmetric plantar responses were flexor bilaterally. Gait and Station: Rising up from seated position without assistance, normal stance,  moderate stride, good arm swing, smooth turning, able to perform  tiptoe, and heel walking without difficulty. Tandem gait is steady  DIAGNOSTIC DATA (LABS, IMAGING, TESTING) - I reviewed patient records, labs, notes, testing and imaging myself where available.  Lab Results  Component Value Date   WBC 10.9 (H) 02/25/2017   HGB 13.6 02/25/2017   HCT 40.4 02/25/2017   MCV 89 02/25/2017   PLT 241 02/25/2017      Component Value Date/Time   NA 142 02/25/2017 0855   K 4.5 02/25/2017 0855   CL 105 02/25/2017 0855   CO2 23 02/25/2017 0855   GLUCOSE 170 (H) 02/25/2017 0855   GLUCOSE 162 (H) 06/10/2016 1217   BUN 21 02/25/2017 0855   CREATININE 0.88 02/25/2017  0855   CALCIUM 9.6 02/25/2017 0855   PROT 7.1 02/25/2017 0855   ALBUMIN 4.4 02/25/2017 0855   AST 14 02/25/2017 0855   ALT 15 02/25/2017 0855   ALKPHOS 74 02/25/2017 0855   BILITOT <0.2 02/25/2017 0855   GFRNONAA 73 02/25/2017 0855   GFRAA 84 02/25/2017 0855   Lab Results  Component Value Date   CHOL 160 02/25/2017   HDL 56 02/25/2017   LDLCALC 81 02/25/2017   TRIG 114 02/25/2017   CHOLHDL 2.9 02/25/2017   Lab Results  Component Value Date   HGBA1C 7.4 04/03/2017    Lab Results  Component Value Date   TSH 2.200 11/28/2016      ASSESSMENT AND PLAN 50 year lady with strokelike episode in March 2018 13 of unclear etiology treated with IV TPA. Chronic recurrent headaches likely mixed migraine and tension headaches which appear to be well controlled on the current medication regime.The patient is a current patient of Dr. Leonie Man  who is out of the office today . This note is sent to the work in doctor.       PLAN:Stressed the importance of management of risk factors to prevent further stroke Continue aspirin for secondary stroke prevention Maintain strict control of hypertension with blood pressure goal below 130/90, today's reading 143/90 continue antihypertensive medications Control of diabetes with hemoglobin A1c below 6.5 followed by primary care continue diabetic  medications patient to see an endocrinologist, most recent hemoglobin A1c 7.4 on 04/03/2017 Cholesterol with LDL cholesterol less than 70, followed by primary care,   continue statin drug Lipitor most recent LDL 81 02/25/2017 Continue Topamax for headache/migraine  follow-up with equipment company about CPAP mask, need to be compliant with CPAP Exercise by walking, 30 minutes daily  eat healthy diet with whole grains,  fresh fruits and vegetables F/U in 8 monthsif stable will dismiss  I spent 25 minutes in total face to face time with the patient more than 50% of which was spent counseling and coordination of care, reviewing test results reviewing medications and discussing and reviewing the diagnosis of stroke and management of risk factors and further treatment options.  Answered questions for patient and the husband, Melissa Rosario, [facility], [facility], [facility] [address], [place], [place] [address] [phone]

## 2017-04-19 ENCOUNTER — Ambulatory Visit: Payer: Self-pay | Admitting: Nurse Practitioner

## 2017-04-22 DIAGNOSIS — R69 Illness, unspecified: Secondary | ICD-10-CM | POA: Diagnosis not present

## 2017-04-23 ENCOUNTER — Other Ambulatory Visit: Payer: Self-pay

## 2017-04-23 DIAGNOSIS — R69 Illness, unspecified: Secondary | ICD-10-CM | POA: Diagnosis not present

## 2017-04-23 MED ORDER — GLUCOSE BLOOD VI STRP: ORAL_STRIP | 1 refills | Status: DC

## 2017-04-25 ENCOUNTER — Ambulatory Visit: Payer: Self-pay | Admitting: Nurse Practitioner

## 2017-05-15 DIAGNOSIS — H401122 Primary open-angle glaucoma, left eye, moderate stage: Secondary | ICD-10-CM | POA: Diagnosis not present

## 2017-05-20 DIAGNOSIS — H401132 Primary open-angle glaucoma, bilateral, moderate stage: Secondary | ICD-10-CM | POA: Diagnosis not present

## 2017-05-20 DIAGNOSIS — H16223 Keratoconjunctivitis sicca, not specified as Sjogren's, bilateral: Secondary | ICD-10-CM | POA: Diagnosis not present

## 2017-05-20 DIAGNOSIS — H401122 Primary open-angle glaucoma, left eye, moderate stage: Secondary | ICD-10-CM | POA: Diagnosis not present

## 2017-05-21 ENCOUNTER — Encounter: Payer: Self-pay | Admitting: Gastroenterology

## 2017-05-21 ENCOUNTER — Ambulatory Visit: Payer: Medicare HMO | Admitting: Gastroenterology

## 2017-05-21 VITALS — BP 134/86 | HR 74 | Ht 61.0 in | Wt 224.0 lb

## 2017-05-21 DIAGNOSIS — K219 Gastro-esophageal reflux disease without esophagitis: Secondary | ICD-10-CM | POA: Diagnosis not present

## 2017-05-21 DIAGNOSIS — K760 Fatty (change of) liver, not elsewhere classified: Secondary | ICD-10-CM

## 2017-05-21 DIAGNOSIS — R1310 Dysphagia, unspecified: Secondary | ICD-10-CM

## 2017-05-21 NOTE — Patient Instructions (Signed)
You have been scheduled for an endoscopy. Please follow written instructions given to you at your visit today. If you use inhalers (even only as needed), please bring them with you on the day of your procedure. Your physician has requested that you go to www.startemmi.com and enter the access code given to you at your visit today. This web site gives a general overview about your procedure. However, you should still follow specific instructions given to you by our office regarding your preparation for the procedure.  Thank you for choosing me and [facility].  Malcolm T. Stark, Jr., MD., FACG  

## 2017-05-21 NOTE — Progress Notes (Addendum)
History of Present Illness: This is a 57 year old female referred by Mina Marble D, NP for the evaluation of dysphagia, abdominal pain, GERD, h/o GU.  She is accompanied by her daughter.  She relates a history of dysphagia in 2012 and states she underwent endoscopy in 2012 by [facility]. A stricture was found and it was dilated which relieved her dysphagia.  She states she had a colonoscopy performed at that time as well which was normal and a 10-year colonoscopy was recommended.  Over the past few months she has had a return of solid food dysphagia.  She was no longer taking a PPI and this was restarted by Kerby Nora NP in January.  She notes occasional heartburn symptoms and frequent episodes of nausea.  The patient complains of diffuse chest and abdominal pain is ongoing problems.  She states these symptoms have been present for several years and feels they are related to fibromyalgia.  The symptoms do not generally change with meals or bowel movements.  CBC, CMP unremarkable except for elevated glucose.  Abdominal ultrasound below. Denies weight loss, constipation, diarrhea, change in stool caliber, melena, hematochezia, vomiting.  Abd [place] 03/2017 IMPRESSION: Post cholecystectomy without biliary dilatation.  Probable fatty infiltration of liver as above.  No definite intrahepatic abnormalities are identified though intrahepatic detail is suboptimally evaluated due to sound attenuation; if better intrahepatic visualization is required recommend CT or MR.  Allergies  Allergen Reactions  . Hydromorphone Hcl Nausea And Vomiting  . Lovastatin Other (See Comments)    myalgia  . Nsaids    Outpatient Medications Prior to Visit  Medication Sig Dispense Refill  . amLODipine (NORVASC) 5 MG tablet 2 tablets by mouth daily, to equal 92m/daily 180 tablet 1  . aspirin EC 81 MG tablet Take 81 mg by mouth daily.    .Marland Kitchenatorvastatin (LIPITOR) 10 MG tablet Take 1 tablet (10 mg total) by mouth  daily. 90 tablet 3  . BAYER MICROLET LANCETS lancets Use to test sugar 1-2 times daily 100 each 1  . Blood Glucose Monitoring Suppl (ONE TOUCH ULTRA 2) w/Device KIT Check glucose 1-2 times daily 1 each 0  . brimonidine (ALPHAGAN) 0.15 % ophthalmic solution Place 1 drop into both eyes 3 (three) times daily.    . clobetasol ointment (TEMOVATE) 04.88% Apply 1 application topically 2 (two) times daily.    .Marland KitchenglipiZIDE (GLUCOTROL XL) 5 MG 24 hr tablet Take 1 tablet (5 mg total) by mouth daily with breakfast. 90 tablet 3  . glucose blood (ONE TOUCH ULTRA TEST) test strip Use to test 2 times daily with One Touch Meter 100 each 1  . latanoprost (XALATAN) 0.005 % ophthalmic solution Place 1 drop into both eyes at bedtime.    .Marland Kitchenlisinopril-hydrochlorothiazide (PRINZIDE,ZESTORETIC) 20-25 MG tablet Take 1 tablet by mouth daily. 90 tablet 3  . omeprazole (PRILOSEC) 40 MG capsule Take 1 capsule (40 mg total) by mouth 2 (two) times daily. 180 capsule 1  . ondansetron (ZOFRAN-ODT) 8 MG disintegrating tablet Take 1 tablet (8 mg total) by mouth every 8 (eight) hours as needed for nausea or vomiting. 20 tablet 0  . sitaGLIPtin (JANUVIA) 100 MG tablet Take 1 tablet (100 mg total) by mouth daily. 30 tablet 5  . timolol (BETIMOL) 0.5 % ophthalmic solution Place 1 drop into both eyes 2 (two) times daily.    .Marland Kitchentopiramate (TOPAMAX) 50 MG tablet Take 1 tablet (50 mg total) by mouth 3 (three) times daily as  needed. 270 tablet 1  . Vitamin D, Ergocalciferol, (DRISDOL) 50000 units CAPS capsule Take 1 capsule (50,000 Units total) by mouth every 7 (seven) days. 16 capsule 0   No facility-administered medications prior to visit.    Past Medical History:  Diagnosis Date  . Diabetes mellitus without complication ([place])   . Fibromyalgia   . Headache   . Hearing loss   . Hyperlipidemia   . Hypertension   . Stroke [facility])    Past Surgical History:  Procedure Laterality Date  . APPENDECTOMY  1982  . CHOLECYSTECTOMY  1982  .  EYE SURGERY Right 04/09/2017  05/20/17   drain fluid from eye  . SHUNT REPLACEMENT  2016  . SHUNT REVISION  2018  . STAPEDES SURGERY Bilateral 2007  . TONSILLECTOMY  1974  . VENTRICULOPERITONEAL SHUNT  10/2015   Social History   Socioeconomic History  . Marital status: Married    Spouse name: None  . Number of children: 3  . Years of education: None  . Highest education level: None  Social Needs  . Financial resource strain: None  . Food insecurity - worry: None  . Food insecurity - inability: None  . Transportation needs - medical: None  . Transportation needs - non-medical: None  Occupational History  . Occupation: Unemployed  Tobacco Use  . Smoking status: Never Smoker  . Smokeless tobacco: Never Used  Substance and Sexual Activity  . Alcohol use: No  . Drug use: No  . Sexual activity: Yes    Partners: Male    Birth control/protection: None  Other Topics Concern  . None  Social History Narrative  . None   Family History  Problem Relation Age of Onset  . Hypertension Mother   . Alzheimer's disease Mother   . Lung cancer Father   . Hypertension Father   . Hypertension Sister   . Alcoholism Brother   . Hypertension Brother   . Diabetes Brother   . Stroke Brother   . Heart attack Maternal Grandfather   . Heart attack Paternal Grandfather   . Hypertension Sister   . Diabetes Brother   . Diabetes Maternal Grandmother       Review of Systems: Pertinent positive and negative review of systems were noted in the above HPI section. All other review of systems were otherwise negative.   Physical Exam: General: Well developed, well nourished, obese, no acute distress Head: Normocephalic and atraumatic Eyes:  sclerae anicteric, EOMI Ears: Normal auditory acuity Mouth: No deformity or lesions Neck: Supple, no masses or thyromegaly Lungs: Clear throughout to auscultation Chest: Diffuse tenderness across her anterior chest to light palpation Heart: Regular rate  and rhythm; no murmurs, rubs or bruits Abdomen: Soft, diffuse tenderness to light palpation across her entire abdomen, large and non distended. No masses, hepatosplenomegaly or hernias noted. Normal Bowel sounds Rectal: note done Musculoskeletal: Symmetrical with no gross deformities  Skin: No lesions on visible extremities Pulses:  Normal pulses noted Extremities: No clubbing, cyanosis, edema or deformities noted Neurological: Alert oriented x 4, grossly nonfocal Cervical Nodes:  No significant cervical adenopathy Inguinal Nodes: No significant inguinal adenopathy Psychological:  Alert and cooperative. Normal mood and affect  Assessment and Recommendations:  1.  Dysphagia and GERD.  Suspected recurrent esophageal stricture.  Continue omeprazole 40 mg twice daily.  Closely follow antireflux measures.  Schedule EGD with possible dilation. The risks (including bleeding, perforation, infection, missed lesions, medication reactions and possible hospitalization or surgery if complications occur), benefits, and alternatives  to endoscopy with possible biopsy and possible dilation were discussed with the patient and they consent to proceed.  Attempt to obtain prior EGD from [facility].  2.  Diffuse abdominal and chest pain.  Strongly suspect this is musculoskeletal in etiology possibly related to fibromyalgia.  3.  CRC screening, average risk.  Attempt to obtain prior colonoscopy from [facility].  4.  Hepatic steatosis. Long-term diabetic, carb modified, fat modified, weight loss diet supervised by her PCP.    cc: Esaw Grandchild, NP [address] [place], [place] [address]

## 2017-05-23 ENCOUNTER — Telehealth: Payer: Self-pay | Admitting: Gastroenterology

## 2017-05-23 NOTE — Telephone Encounter (Signed)
Patient wanting to know what she can do to insure that her IV will go in smoothly for procedure tomorrow 3.1.19 @9 :30AM. Pt states she usually has a hard time getting it to stick.

## 2017-05-23 NOTE — Telephone Encounter (Signed)
Spoke with pt and encouraged her to push fluids till tomorrow at 630.  Understanding voiced and will push fluids.

## 2017-05-24 ENCOUNTER — Encounter: Payer: Self-pay | Admitting: Gastroenterology

## 2017-05-24 ENCOUNTER — Other Ambulatory Visit: Payer: Self-pay

## 2017-05-24 ENCOUNTER — Other Ambulatory Visit: Payer: Self-pay | Admitting: Adult Health

## 2017-05-24 ENCOUNTER — Ambulatory Visit (AMBULATORY_SURGERY_CENTER): Payer: Medicare HMO | Admitting: Gastroenterology

## 2017-05-24 VITALS — BP 138/65 | HR 57 | Temp 98.4°F | Resp 18 | Ht 61.0 in | Wt 224.0 lb

## 2017-05-24 DIAGNOSIS — E119 Type 2 diabetes mellitus without complications: Secondary | ICD-10-CM | POA: Diagnosis not present

## 2017-05-24 DIAGNOSIS — K222 Esophageal obstruction: Secondary | ICD-10-CM | POA: Diagnosis not present

## 2017-05-24 DIAGNOSIS — R1310 Dysphagia, unspecified: Secondary | ICD-10-CM | POA: Diagnosis not present

## 2017-05-24 DIAGNOSIS — I10 Essential (primary) hypertension: Secondary | ICD-10-CM | POA: Diagnosis not present

## 2017-05-24 DIAGNOSIS — Z8673 Personal history of transient ischemic attack (TIA), and cerebral infarction without residual deficits: Secondary | ICD-10-CM | POA: Diagnosis not present

## 2017-05-24 DIAGNOSIS — M797 Fibromyalgia: Secondary | ICD-10-CM | POA: Diagnosis not present

## 2017-05-24 MED ORDER — SODIUM CHLORIDE 0.9 % IV SOLN: 500.0000 mL | Freq: Once | INTRAVENOUS | Status: AC

## 2017-05-24 NOTE — Op Note (Addendum)
[facility] Patient Name: Melissa Rosario Procedure Date: 05/24/2017 9:24 AM MRN: [id number] Endoscopist: Meryl Dare , MD Age: 57 Referring MD:  Date of Birth: January 23, 1961 Gender: Female Account #: [id number] Procedure:                Upper GI endoscopy Indications:              Dysphagia Medicines:                Monitored Anesthesia Care Procedure:                Pre-Anesthesia Assessment:                           - Prior to the procedure, a History and Physical                            was performed, and patient medications and                            allergies were reviewed. The patient's tolerance of                            previous anesthesia was also reviewed. The risks                            and benefits of the procedure and the sedation                            options and risks were discussed with the patient.                            All questions were answered, and informed consent                            was obtained. Prior Anticoagulants: The patient has                            taken no previous anticoagulant or antiplatelet                            agents. ASA Grade Assessment: III - A patient with                            severe systemic disease. After reviewing the risks                            and benefits, the patient was deemed in                            satisfactory condition to undergo the procedure.                           After obtaining informed consent, the endoscope was  passed under direct vision. Throughout the                            procedure, the patient's blood pressure, pulse, and                            oxygen saturations were monitored continuously. The                            Endoscope was introduced through the mouth, and                            advanced to the second part of duodenum. The upper                            GI endoscopy was accomplished without  difficulty.                            The patient tolerated the procedure well. Scope In: Scope Out: Findings:                 One moderate benign-appearing, intrinsic stenosis                            was found at the gastroesophageal junction. This                            measured 1.3 cm (inner diameter) and was traversed.                            A guidewire was placed and the scope was withdrawn.                            Dilations were performed with Savary dilators with                            mild resistance at 14 mm, 15 mm and 16 mm. Small                            amount of heme on last dilator. Estimated blood                            loss: minimal.                           The exam of the esophagus was otherwise normal.                           A small hiatal hernia was present.                           The exam of the stomach was otherwise normal.  The duodenal bulb was normal.                           A single localized erosion without bleeding was                            found in the second portion of the duodenum. Complications:            No immediate complications. Estimated Blood Loss:     Estimated blood loss was minimal. Impression:               - Benign-appearing esophageal stenosis. Dilated.                           - Small hiatal hernia.                           - Normal duodenal bulb.                           - Duodenal erosion without bleeding.                           - No specimens collected. Recommendation:           - Patient has a contact number available for                            emergencies. The signs and symptoms of potential                            delayed complications were discussed with the                            patient. Return to normal activities tomorrow.                            Written discharge instructions were provided to the                            patient.                            - Clear liquid diet for 2 hours, then advance as                            tolerated to soft diet today. Resume prior diet                            tomorrow.                           - Follow antireflux measures                           - Continue present medications.                           -  Return to GI office in 2 months. Meryl DareMalcolm T Stark, MD 05/24/2017 9:45:29 AM This report has been signed electronically.

## 2017-05-24 NOTE — Patient Instructions (Signed)
YOU HAD AN ENDOSCOPIC PROCEDURE TODAY AT THE [facility]:   Refer to the procedure report that was given to you for any specific questions about what was found during the examination.  If the procedure report does not answer your questions, please call your gastroenterologist to clarify.  If you requested that your care partner not be given the details of your procedure findings, then the procedure report has been included in a sealed envelope for you to review at your convenience later.  YOU SHOULD EXPECT: Some feelings of bloating in the abdomen. Passage of more gas than usual.  Walking can help get rid of the air that was put into your GI tract during the procedure and reduce the bloating. If you had a lower endoscopy (such as a colonoscopy or flexible sigmoidoscopy) you may notice spotting of blood in your stool or on the toilet paper. If you underwent a bowel prep for your procedure, you may not have a normal bowel movement for a few days.  Please Note:  You might notice some irritation and congestion in your nose or some drainage.  This is from the oxygen used during your procedure.  There is no need for concern and it should clear up in a day or so.  SYMPTOMS TO REPORT IMMEDIATELY:    Following upper endoscopy (EGD)  Vomiting of blood or coffee ground material  New chest pain or pain under the shoulder blades  Painful or persistently difficult swallowing  New shortness of breath  Fever of 100F or higher  Black, tarry-looking stools  For urgent or emergent issues, a gastroenterologist can be reached at any hour by calling [phone].   DIET:  We do recommend a small meal at first, but then you may proceed to your regular diet.  Drink plenty of fluids but you should avoid alcoholic beverages for 24 hours.  ACTIVITY:  You should plan to take it easy for the rest of today and you should NOT DRIVE or use heavy machinery until tomorrow (because of the sedation medicines used  during the test).    FOLLOW UP: Our staff will call the number listed on your records the next business day following your procedure to check on you and address any questions or concerns that you may have regarding the information given to you following your procedure. If we do not reach you, we will leave a message.  However, if you are feeling well and you are not experiencing any problems, there is no need to return our call.  We will assume that you have returned to your regular daily activities without incident.  If any biopsies were taken you will be contacted by phone or by letter within the next 1-3 weeks.  Please call us at [phone] if you have not heard about the biopsies in 3 weeks.    SIGNATURES/CONFIDENTIALITY: You and/or your care partner have signed paperwork which will be entered into your electronic medical record.  These signatures attest to the fact that that the information above on your After Visit Summary has been reviewed and is understood.  Full responsibility of the confidentiality of this discharge information lies with you and/or your care-partner.  Clear liquids for 2 hours , the advance as tolerated to soft diet, resume regular diet tomorrow.  Gave post dilation diet. Anti reflux measure and hiatal hernia handout given. Continue present meds.  See Dr. Russella DarStark in office in 2 months.

## 2017-05-24 NOTE — Progress Notes (Signed)
Report to PACU, RN, vss, BBS= Clear.  

## 2017-05-27 ENCOUNTER — Telehealth: Payer: Self-pay | Admitting: *Deleted

## 2017-05-27 NOTE — Telephone Encounter (Signed)
  Follow up Call-  Call back number 05/24/2017  Post procedure Call Back phone  # [phone]  Permission to leave phone message Yes     Patient questions:  Do you have a fever, pain , or abdominal swelling? No. Pain Score  0 *  Have you tolerated food without any problems? Yes.    Have you been able to return to your normal activities? Yes.    Do you have any questions about your discharge instructions: Diet   No. Medications  No. Follow up visit  No.  Do you have questions or concerns about your Care? No.  Actions: * If pain score is 4 or above: No action needed, pain <4.  Pt. Stated that on bottom of lip there is a weird sensation,no pain noted felt like she might have bit her lip,encourage her to use cool compress to area and watch area and if she any more problems to call us back,she verbalize understanding.

## 2017-05-27 NOTE — Telephone Encounter (Signed)
Message left

## 2017-05-31 ENCOUNTER — Other Ambulatory Visit: Payer: Self-pay

## 2017-05-31 MED ORDER — VITAMIN D (ERGOCALCIFEROL) 1.25 MG (50000 UNIT) PO CAPS: 50000.0000 [IU] | ORAL_CAPSULE | ORAL | 0 refills | Status: AC

## 2017-06-12 DIAGNOSIS — R69 Illness, unspecified: Secondary | ICD-10-CM | POA: Diagnosis not present

## 2017-06-14 ENCOUNTER — Telehealth: Payer: Self-pay | Admitting: Nurse Practitioner

## 2017-06-14 NOTE — Telephone Encounter (Signed)
Pt called topiramate tablets is a tier 4 but the sprinkles are tier 2. Could rx be changed to sprinkles [facility] [place] [facility] [place] She is aware the clinic closes at noon today

## 2017-06-14 NOTE — Telephone Encounter (Signed)
Spoke to pt and she states her insurance was telling her the tier change would help with cost.  Spoke to [place], Teacher, early years/prepharmacist  with [facility] [address] [place] and she stated ran thru for topiramate 50mg  tabs #270 and was no copay.  Pt has been taking 50mg  po TID.  Pt informed of this and will keep with tabs.  If problem will call me back Monday.

## 2017-06-14 NOTE — Telephone Encounter (Signed)
Yes you can change

## 2017-06-23 ENCOUNTER — Other Ambulatory Visit: Payer: Self-pay | Admitting: Internal Medicine

## 2017-06-24 DIAGNOSIS — R69 Illness, unspecified: Secondary | ICD-10-CM | POA: Diagnosis not present

## 2017-07-01 ENCOUNTER — Ambulatory Visit: Payer: Medicare HMO | Admitting: Internal Medicine

## 2017-07-01 ENCOUNTER — Ambulatory Visit: Payer: Medicare HMO | Admitting: Family Medicine

## 2017-07-01 DIAGNOSIS — E785 Hyperlipidemia, unspecified: Secondary | ICD-10-CM | POA: Diagnosis not present

## 2017-07-01 DIAGNOSIS — H409 Unspecified glaucoma: Secondary | ICD-10-CM | POA: Diagnosis not present

## 2017-07-01 DIAGNOSIS — G4733 Obstructive sleep apnea (adult) (pediatric): Secondary | ICD-10-CM | POA: Diagnosis not present

## 2017-07-01 DIAGNOSIS — I10 Essential (primary) hypertension: Secondary | ICD-10-CM | POA: Diagnosis not present

## 2017-07-01 DIAGNOSIS — M797 Fibromyalgia: Secondary | ICD-10-CM | POA: Diagnosis not present

## 2017-07-01 DIAGNOSIS — G8194 Hemiplegia, unspecified affecting left nondominant side: Secondary | ICD-10-CM | POA: Diagnosis not present

## 2017-07-01 DIAGNOSIS — G43909 Migraine, unspecified, not intractable, without status migrainosus: Secondary | ICD-10-CM | POA: Diagnosis not present

## 2017-07-01 DIAGNOSIS — I6930 Unspecified sequelae of cerebral infarction: Secondary | ICD-10-CM | POA: Diagnosis not present

## 2017-07-01 DIAGNOSIS — Z6841 Body Mass Index (BMI) 40.0 and over, adult: Secondary | ICD-10-CM | POA: Diagnosis not present

## 2017-07-01 DIAGNOSIS — E1142 Type 2 diabetes mellitus with diabetic polyneuropathy: Secondary | ICD-10-CM | POA: Diagnosis not present

## 2017-07-11 ENCOUNTER — Other Ambulatory Visit: Payer: Self-pay | Admitting: Adult Health

## 2017-07-26 ENCOUNTER — Other Ambulatory Visit: Payer: Self-pay | Admitting: Internal Medicine

## 2017-07-30 ENCOUNTER — Other Ambulatory Visit: Payer: Self-pay | Admitting: Internal Medicine

## 2017-07-30 DIAGNOSIS — R69 Illness, unspecified: Secondary | ICD-10-CM | POA: Diagnosis not present

## 2017-08-16 ENCOUNTER — Other Ambulatory Visit: Payer: Self-pay | Admitting: Adult Health

## 2017-09-15 DIAGNOSIS — R69 Illness, unspecified: Secondary | ICD-10-CM | POA: Diagnosis not present

## 2017-09-17 DIAGNOSIS — R69 Illness, unspecified: Secondary | ICD-10-CM | POA: Diagnosis not present

## 2017-10-24 DIAGNOSIS — H401132 Primary open-angle glaucoma, bilateral, moderate stage: Secondary | ICD-10-CM | POA: Diagnosis not present

## 2017-10-24 DIAGNOSIS — H401122 Primary open-angle glaucoma, left eye, moderate stage: Secondary | ICD-10-CM | POA: Diagnosis not present

## 2017-11-07 ENCOUNTER — Other Ambulatory Visit: Payer: Self-pay | Admitting: Internal Medicine

## 2017-11-07 DIAGNOSIS — R69 Illness, unspecified: Secondary | ICD-10-CM | POA: Diagnosis not present

## 2017-11-10 ENCOUNTER — Other Ambulatory Visit: Payer: Self-pay | Admitting: Internal Medicine

## 2017-11-13 DIAGNOSIS — H53431 Sector or arcuate defects, right eye: Secondary | ICD-10-CM | POA: Diagnosis not present

## 2017-11-13 DIAGNOSIS — H3589 Other specified retinal disorders: Secondary | ICD-10-CM | POA: Diagnosis not present

## 2017-11-13 DIAGNOSIS — Z7984 Long term (current) use of oral hypoglycemic drugs: Secondary | ICD-10-CM | POA: Diagnosis not present

## 2017-11-13 DIAGNOSIS — E119 Type 2 diabetes mellitus without complications: Secondary | ICD-10-CM | POA: Diagnosis not present

## 2017-11-13 DIAGNOSIS — H5712 Ocular pain, left eye: Secondary | ICD-10-CM | POA: Diagnosis not present

## 2017-11-13 DIAGNOSIS — Z8673 Personal history of transient ischemic attack (TIA), and cerebral infarction without residual deficits: Secondary | ICD-10-CM | POA: Diagnosis not present

## 2017-11-13 DIAGNOSIS — H04123 Dry eye syndrome of bilateral lacrimal glands: Secondary | ICD-10-CM | POA: Diagnosis not present

## 2017-11-13 DIAGNOSIS — H409 Unspecified glaucoma: Secondary | ICD-10-CM | POA: Diagnosis not present

## 2017-11-13 DIAGNOSIS — G932 Benign intracranial hypertension: Secondary | ICD-10-CM | POA: Diagnosis not present

## 2017-11-13 DIAGNOSIS — H47231 Glaucomatous optic atrophy, right eye: Secondary | ICD-10-CM | POA: Diagnosis not present

## 2017-12-17 ENCOUNTER — Ambulatory Visit: Payer: Medicare HMO | Admitting: Nurse Practitioner

## 2017-12-17 ENCOUNTER — Other Ambulatory Visit: Payer: Self-pay | Admitting: Neurology

## 2017-12-17 DIAGNOSIS — I639 Cerebral infarction, unspecified: Principal | ICD-10-CM

## 2017-12-17 DIAGNOSIS — H5213 Myopia, bilateral: Secondary | ICD-10-CM | POA: Diagnosis not present

## 2017-12-17 DIAGNOSIS — R2990 Unspecified symptoms and signs involving the nervous system: Secondary | ICD-10-CM

## 2017-12-18 ENCOUNTER — Other Ambulatory Visit: Payer: Self-pay

## 2017-12-18 DIAGNOSIS — I639 Cerebral infarction, unspecified: Principal | ICD-10-CM

## 2017-12-18 DIAGNOSIS — R2990 Unspecified symptoms and signs involving the nervous system: Secondary | ICD-10-CM

## 2017-12-18 MED ORDER — TOPIRAMATE 50 MG PO TABS: 50.0000 mg | ORAL_TABLET | Freq: Three times a day (TID) | ORAL | 0 refills | Status: DC | PRN

## 2017-12-20 ENCOUNTER — Other Ambulatory Visit: Payer: Self-pay | Admitting: Internal Medicine

## 2017-12-23 DIAGNOSIS — R69 Illness, unspecified: Secondary | ICD-10-CM | POA: Diagnosis not present

## 2018-01-01 DIAGNOSIS — R69 Illness, unspecified: Secondary | ICD-10-CM | POA: Diagnosis not present

## 2018-01-20 DIAGNOSIS — R69 Illness, unspecified: Secondary | ICD-10-CM | POA: Diagnosis not present

## 2018-01-23 DIAGNOSIS — R69 Illness, unspecified: Secondary | ICD-10-CM | POA: Diagnosis not present

## 2018-02-04 ENCOUNTER — Other Ambulatory Visit: Payer: Self-pay | Admitting: Adult Health

## 2018-02-05 DIAGNOSIS — I10 Essential (primary) hypertension: Secondary | ICD-10-CM | POA: Diagnosis not present

## 2018-02-05 DIAGNOSIS — E119 Type 2 diabetes mellitus without complications: Secondary | ICD-10-CM | POA: Diagnosis not present

## 2018-02-05 DIAGNOSIS — H401132 Primary open-angle glaucoma, bilateral, moderate stage: Secondary | ICD-10-CM | POA: Diagnosis not present

## 2018-02-05 DIAGNOSIS — H16223 Keratoconjunctivitis sicca, not specified as Sjogren's, bilateral: Secondary | ICD-10-CM | POA: Diagnosis not present

## 2018-02-05 DIAGNOSIS — E669 Obesity, unspecified: Secondary | ICD-10-CM | POA: Diagnosis not present

## 2018-02-08 DIAGNOSIS — R69 Illness, unspecified: Secondary | ICD-10-CM | POA: Diagnosis not present

## 2018-02-25 DIAGNOSIS — E119 Type 2 diabetes mellitus without complications: Secondary | ICD-10-CM | POA: Diagnosis not present

## 2018-02-25 DIAGNOSIS — D649 Anemia, unspecified: Secondary | ICD-10-CM | POA: Diagnosis not present

## 2018-02-25 DIAGNOSIS — I10 Essential (primary) hypertension: Secondary | ICD-10-CM | POA: Diagnosis not present

## 2018-02-26 NOTE — Progress Notes (Deleted)
[facility]  PATIENT: Melissa Rosario DOB: Oct 29, 1960   REASON FOR VISIT: Follow-up for strokelike episodes, March 2018 HISTORY FROM: Patient    HISTORY OF PRESENT ILLNESS:Ms Melissa Rosario is a 57 year Caucasian lady who seen today after hospital admission in March 2018. She is accompanied by husband. History is obtained from the patient, husband, review of electronic medical records and have personally reviewed imaging films.Melissa Adamsis a 57 y.o.femalewith a hx of VP shunt placed 2 years ago. She was last normal at 11am 06/10/2016 ([facility])speaking with family, then suddenly developed left retroorbital pain and left facial numbness and slurred speech. She was rushed to the ED as a code stroke. Dr. Wendee Beavers did not receive a call when the patient arrived at the hospital and only found the pt after her CT had been completed. On his assessment she had an NIHSS = 4 and no contraindications to tPA. He asked emphatically whether her weakness was chronic and she denied this. She also does not take any anticoagulation. He discussed risks and benefits of treatment including the possibility of intracranial bleeding; pt agreed to be treated. She received IV tPA 06/10/2016 at 1244. She was admitted to the neuro ICU for further evaluation and treatment.  She remained neurologically stable and made a full recovery. Blood pressure was tightly controlled. MRI scan of the brain showed no evidence of acute infarct. Right frontal ventricular peritoneal shunt catheter was noted. Ventricular size was not enlarged. MRA of the brain showed no significant intracranial and carotid ultrasound showed no significant extracranial stenosis. Transthoracic echo showed normal ejection fraction without cardiac source of embolism. LDL cholesterol was elevated 127 mg percent. Hemoglobin A1c was 6.4. Patient was started on aspirin for stroke prevention which is tolerating well without bruising or bleeding. She states her  blood pressure well controlled today it is 113/69. She had a follow-up in about an A1c checked a few weeks ago which was satisfactory at 6.2. She is tolerating her statin without muscle aches or pains. The patient has a new complaint today of chronic headaches which she feels started after her strokelike episode. She did have a remote history of migraine headaches but these gradually resolved over the years. She describes the headaches now occurring at a frequency of once per week. She does take baclofen as needed and Topamax therapy times daily which provided relief. She does see a neurologist Dr. Cherly Beach in [place] for headaches. She has currently applied for disability and has no health insurance and hence would not want any further workup or changes in medications at the present time. UPDATE 1/24/2019CM Ms. Melissa Rosario, 57 year old female returns for follow-up with history of strokelike symptoms in March 2018.  She received IV TPA.  MRI with no evidence of acute stroke. MRA of the brain showed no significant intracranial and carotid ultrasound showed no significant extracranial stenosis. Transthoracic echo showed normal ejection fraction without cardiac source of embolism.  She remains on aspirin for secondary stroke prevention without further stroke or TIA symptoms.  She is on Lipitor without myalgias.  Labs are followed care.  Diabetes has not been well controlled, CBGs 140 and Up.  She is due to see an endocrinologist.  She has obstructive sleep apnea but does not use her CPAP because  mask does not fit.  She just had a laser on her right eye, she is due to have the left eye done soon.  She also has bilateral cataracts.  Her headaches are controlled on Topamax.  She has about 1 headache per week.  Blood pressure in the office today 143/90, she has not taken her blood pressure medicine this morning.  Currently is having a lot of abdominal pain and is having a CT later this afternoon.  She returns for  reevaluation REVIEW OF SYSTEMS: Full 14 system review of systems performed and notable only for those listed, all others are neg:  Constitutional: Fatigue Cardiovascular: neg Ear/Nose/Throat: neg  Skin: neg Eyes: Light sensitivity Respiratory: neg Gastroitestinal: Abdominal pain Hematology/Lymphatic: neg  Endocrine: neg Musculoskeletal:neg Allergy/Immunology: neg Neurological: Headache Psychiatric: neg Sleep : neg   ALLERGIES: Allergies  Allergen Reactions  . Hydromorphone Hcl Nausea And Vomiting  . Lovastatin Other (See Comments)    myalgia  . Nsaids     HOME MEDICATIONS: Outpatient Medications Prior to Visit  Medication Sig Dispense Refill  . amLODipine (NORVASC) 5 MG tablet TAKE 2 TABLETS BY MOUTH DAILY.  PATIENT MUST HAVE OFFICE VISIT PRIOR TO ANY FURTHER REFILLS 60 tablet 0  . aspirin EC 81 MG tablet Take 81 mg by mouth daily.    Marland Kitchen atorvastatin (LIPITOR) 10 MG tablet Take 1 tablet (10 mg total) by mouth daily. PATIENT MUST HAVE OFFICE VISIT PRIOR TO ANY FURTHER REFILLS 30 tablet 0  . Blood Glucose Monitoring Suppl (ONE TOUCH ULTRA 2) w/Device KIT Check glucose 1-2 times daily 1 each 0  . brimonidine (ALPHAGAN) 0.15 % ophthalmic solution Place 1 drop into both eyes 3 (three) times daily.    . clobetasol ointment (TEMOVATE) 1.61 % Apply 1 application topically 2 (two) times daily.    Marland Kitchen glipiZIDE (GLUCOTROL XL) 5 MG 24 hr tablet Take 1 tablet (5 mg total) by mouth daily with breakfast. 90 tablet 3  . glucose blood (ONE TOUCH ULTRA TEST) test strip USE TO TEST 2 TIMES DAILY WITH ONE TOUCH METER 100 each 1  . JANUVIA 100 MG tablet TAKE 1 TABLET BY MOUTH EVERY DAY 90 tablet 0  . latanoprost (XALATAN) 0.005 % ophthalmic solution Place 1 drop into both eyes at bedtime.    Marland Kitchen lisinopril-hydrochlorothiazide (PRINZIDE,ZESTORETIC) 20-25 MG tablet Take 1 tablet by mouth daily. 90 tablet 3  . omeprazole (PRILOSEC) 40 MG capsule Take 1 capsule (40 mg total) by mouth 2 (two) times daily.  180 capsule 1  . ondansetron (ZOFRAN-ODT) 8 MG disintegrating tablet Take 1 tablet (8 mg total) by mouth every 8 (eight) hours as needed for nausea or vomiting. 20 tablet 0  . ONETOUCH DELICA LANCETS FINE MISC USE TO TEST SUGAR 1-2 TIMES DAILY 100 each 1  . timolol (BETIMOL) 0.5 % ophthalmic solution Place 1 drop into both eyes 2 (two) times daily.    Marland Kitchen topiramate (TOPAMAX) 50 MG tablet Take 1 tablet (50 mg total) by mouth 3 (three) times daily as needed. 270 tablet 0  . Vitamin D, Ergocalciferol, (DRISDOL) 50000 units CAPS capsule Take 1 capsule (50,000 Units total) by mouth every 7 (seven) days. Patient needs repeat labs prior to any further refills. 6 capsule 0   Facility-Administered Medications Prior to Visit  Medication Dose Route Frequency Provider Last Rate Last Dose  . 0.9 %  sodium chloride infusion  500 mL Intravenous Once Ladene Artist, MD        PAST MEDICAL HISTORY: Past Medical History:  Diagnosis Date  . Anxiety   . Depression   . Diabetes mellitus without complication ([place])   . Fibromyalgia   . Glaucoma   . Headache   . Hearing loss   .  Hyperlipidemia   . Hypertension   . Sleep apnea    wears CPAP  . Stroke [facility])     PAST SURGICAL HISTORY: Past Surgical History:  Procedure Laterality Date  . APPENDECTOMY  1982  . CHOLECYSTECTOMY  1982  . EYE SURGERY Right 04/09/2017  05/20/17   drain fluid from eye  . SHUNT REPLACEMENT  2016  . SHUNT REVISION  2018  . STAPEDES SURGERY Bilateral 2007  . TONSILLECTOMY  1974  . VENTRICULOPERITONEAL SHUNT  10/2015    FAMILY HISTORY: Family History  Problem Relation Age of Onset  . Hypertension Mother   . Alzheimer's disease Mother   . Lung cancer Father   . Hypertension Father   . Hypertension Sister   . Alcoholism Brother   . Hypertension Brother   . Diabetes Brother   . Stroke Brother   . Heart attack Maternal Grandfather   . Heart attack Paternal Grandfather   . Hypertension Sister   . Diabetes Brother   .  Diabetes Maternal Grandmother   . Esophageal cancer Maternal Uncle   . Colon cancer Neg Hx     SOCIAL HISTORY: Social History   Socioeconomic History  . Marital status: Married    Spouse name: Not on file  . Number of children: 3  . Years of education: Not on file  . Highest education level: Not on file  Occupational History  . Occupation: Unemployed  Social Needs  . Financial resource strain: Not on file  . Food insecurity:    Worry: Not on file    Inability: Not on file  . Transportation needs:    Medical: Not on file    Non-medical: Not on file  Tobacco Use  . Smoking status: Never Smoker  . Smokeless tobacco: Never Used  Substance and Sexual Activity  . Alcohol use: No  . Drug use: No  . Sexual activity: Yes    Partners: Male    Birth control/protection: None  Lifestyle  . Physical activity:    Days per week: Not on file    Minutes per session: Not on file  . Stress: Not on file  Relationships  . Social connections:    Talks on phone: Not on file    Gets together: Not on file    Attends religious service: Not on file    Active member of club or organization: Not on file    Attends meetings of clubs or organizations: Not on file    Relationship status: Not on file  . Intimate partner violence:    Fear of current or ex partner: Not on file    Emotionally abused: Not on file    Physically abused: Not on file    Forced sexual activity: Not on file  Other Topics Concern  . Not on file  Social History Narrative  . Not on file     PHYSICAL EXAM  There were no vitals filed for this visit. There is no height or weight on file to calculate BMI.  Generalized: Well developed, morbidly obese female in no acute distress  Head: normocephalic and atraumatic,. Oropharynx benign  Neck: Supple, no carotid bruits  Cardiac: Regular rate rhythm, no murmur  Musculoskeletal: No deformity   Neurological examination   Mentation: Alert oriented to time, place, history  taking. Attention span and concentration appropriate. Recent and remote memory intact.  Follows all commands speech and language fluent.   Cranial nerve II-XII: Pupils were equal round reactive to light extraocular movements were  full, visual field were full on confrontational test. Facial sensation and strength were normal. hearing was intact to finger rubbing bilaterally. Uvula tongue midline. head turning and shoulder shrug were normal and symmetric.Tongue protrusion into cheek strength was normal. Motor: normal bulk and tone, full strength in the BUE, BLE, fine finger movements normal, no pronator drift. No focal weakness Sensory: normal and symmetric to light touch, pinprick, and  Vibration, in the upper and lower extremities Coordination: finger-nose-finger, heel-to-shin bilaterally, no dysmetria, no tremor Reflexes: 1+ upper lower and symmetric plantar responses were flexor bilaterally. Gait and Station: Rising up from seated position without assistance, normal stance,  moderate stride, good arm swing, smooth turning, able to perform tiptoe, and heel walking without difficulty. Tandem gait is steady  DIAGNOSTIC DATA (LABS, IMAGING, TESTING) - I reviewed patient records, labs, notes, testing and imaging myself where available.  Lab Results  Component Value Date   WBC 10.9 (H) 02/25/2017   HGB 13.6 02/25/2017   HCT 40.4 02/25/2017   MCV 89 02/25/2017   PLT 241 02/25/2017      Component Value Date/Time   NA 142 02/25/2017 0855   K 4.5 02/25/2017 0855   CL 105 02/25/2017 0855   CO2 23 02/25/2017 0855   GLUCOSE 170 (H) 02/25/2017 0855   GLUCOSE 162 (H) 06/10/2016 1217   BUN 21 02/25/2017 0855   CREATININE 0.88 02/25/2017 0855   CALCIUM 9.6 02/25/2017 0855   PROT 7.1 02/25/2017 0855   ALBUMIN 4.4 02/25/2017 0855   AST 14 02/25/2017 0855   ALT 15 02/25/2017 0855   ALKPHOS 74 02/25/2017 0855   BILITOT <0.2 02/25/2017 0855   GFRNONAA 73 02/25/2017 0855   GFRAA 84 02/25/2017 0855     Lab Results  Component Value Date   CHOL 160 02/25/2017   HDL 56 02/25/2017   LDLCALC 81 02/25/2017   TRIG 114 02/25/2017   CHOLHDL 2.9 02/25/2017   Lab Results  Component Value Date   HGBA1C 7.4 04/03/2017    Lab Results  Component Value Date   TSH 2.200 11/28/2016      ASSESSMENT AND PLAN 70 year lady with strokelike episode in March 2018 13 of unclear etiology treated with IV TPA. Chronic recurrent headaches likely mixed migraine and tension headaches which appear to be well controlled on the current medication regime.The patient is a current patient of Dr. Leonie Man  who is out of the office today . This note is sent to the work in doctor.       PLAN:Stressed the importance of management of risk factors to prevent further stroke Continue aspirin for secondary stroke prevention Maintain strict control of hypertension with blood pressure goal below 130/90, today's reading 143/90 continue antihypertensive medications Control of diabetes with hemoglobin A1c below 6.5 followed by primary care continue diabetic medications patient to see an endocrinologist, most recent hemoglobin A1c 7.4 on 04/03/2017 Cholesterol with LDL cholesterol less than 70, followed by primary care,   continue statin drug Lipitor most recent LDL 81 02/25/2017 Continue Topamax for headache/migraine  follow-up with equipment company about CPAP mask, need to be compliant with CPAP Exercise by walking, 30 minutes daily  eat healthy diet with whole grains,  fresh fruits and vegetables F/U in 8 monthsif stable will dismiss  I spent 25 minutes in total face to face time with the patient more than 50% of which was spent counseling and coordination of care, reviewing test results reviewing medications and discussing and reviewing the diagnosis of stroke and management  of risk factors and further treatment options.  Answered questions for patient and the husband, Dennie Bible, [facility], [facility], [facility] [address], [place], [place] [address] [phone]

## 2018-02-27 ENCOUNTER — Ambulatory Visit: Payer: Medicare HMO | Admitting: Nurse Practitioner

## 2018-02-27 ENCOUNTER — Telehealth: Payer: Self-pay | Admitting: *Deleted

## 2018-02-27 NOTE — Telephone Encounter (Signed)
Spoke with patient to reschedule her FU due to computers being down. She stated she was in office earlier today because she didn't receive message in time.  She stated she wanted to be seen due to headaches. However she did understand that she couldn't be seen with computer situation. This RN apologized and looked at schedule.  This RN advised her that the next available is Feb. She took next available and stated she has been having headaches for a few weeks. She stated she doesn't think the topiramate is helping any longer. She saw her PCP who wouldn't prescribe her anything due to her past history of shunt placement. She stated she cannot wait to have something done for her headaches.  This RN advised will let Dr Pearlean BrownieSethi know.  She verbalized understanding, appreciation.

## 2018-02-28 NOTE — Telephone Encounter (Signed)
I called the patient's listed #[phone] but there was no direct tone in the call did not go through.  It appears that the patient has seen Latrelle Dodrillaroline Martin nurse practitioner and she can be rescheduled to see her as she could not be seen this week.  Her Topamax dose probably needs to be increased to 100 mg twice daily if she is tolerating it well without side effects.

## 2018-03-03 NOTE — Telephone Encounter (Signed)
Pt requesting a call stating the medication hasn't be sent to the pharmacy. Stating she isn't able to keep waiting for the medication. Please advise.

## 2018-03-03 NOTE — Telephone Encounter (Signed)
LVM informing patient that Dr Pearlean BrownieSethi had tried to reach on Friday afternoon her but was unable. Advised her that he recommends she come in to see NP. Informed her of several  openings tomorrow and Wed, left office number and advised she call back as soon as possible to schedule a FU with Melissa Skeens Martin, NP in the next day or two. If patient calls back, phone staff may schedule her for soonest FU she can come in, Melissa Skeens Martin, NP.

## 2018-03-03 NOTE — Telephone Encounter (Signed)
Pt returning RNs call scheduled appt with CM on 12/11 at 8:45

## 2018-03-04 NOTE — Progress Notes (Signed)
[facility]  PATIENT: Melissa Rosario DOB: 12/17/1960   REASON FOR VISIT: Follow-up for strokelike episodes, March 2018 Headaches, morning headache, history of obstructive sleep apnea noncompliant with CPAP HISTORY FROM: Patient    HISTORY OF PRESENT ILLNESS:Melissa Rosario is a 61 year Caucasian lady who seen today after hospital admission in March 2018. She is accompanied by husband. History is obtained from the patient, husband, review of electronic medical records and have personally reviewed imaging films.Tmya Adamsis a 57 y.o.femalewith a hx of VP shunt placed 2 years ago. She was last normal at 11am 06/10/2016 ([facility])speaking with family, then suddenly developed left retroorbital pain and left facial numbness and slurred speech. She was rushed to the ED as a code stroke. Dr. Wendee Beavers did not receive a call when the patient arrived at the hospital and only found the pt after her CT had been completed. On his assessment she had an NIHSS = 4 and no contraindications to tPA. He asked emphatically whether her weakness was chronic and she denied this. She also does not take any anticoagulation. He discussed risks and benefits of treatment including the possibility of intracranial bleeding; pt agreed to be treated. She received IV tPA 06/10/2016 at 1244. She was admitted to the neuro ICU for further evaluation and treatment.  She remained neurologically stable and made a full recovery. Blood pressure was tightly controlled. MRI scan of the brain showed no evidence of acute infarct. Right frontal ventricular peritoneal shunt catheter was noted. Ventricular size was not enlarged. MRA of the brain showed no significant intracranial and carotid ultrasound showed no significant extracranial stenosis. Transthoracic echo showed normal ejection fraction without cardiac source of embolism. LDL cholesterol was elevated 127 mg percent. Hemoglobin A1c was 6.4. Patient was started on aspirin for  stroke prevention which is tolerating well without bruising or bleeding. She states her blood pressure well controlled today it is 113/69. She had a follow-up in about an A1c checked a few weeks ago which was satisfactory at 6.2. She is tolerating her statin without muscle aches or pains. The patient has a new complaint today of chronic headaches which she feels started after her strokelike episode. She did have a remote history of migraine headaches but these gradually resolved over the years. She describes the headaches now occurring at a frequency of once per week. She does take baclofen as needed and Topamax therapy times daily which provided relief. She does see a neurologist Dr. Cherly Beach in [place] for headaches. She has currently applied for disability and has no health insurance and hence would not want any further workup or changes in medications at the present time. UPDATE 1/24/2019CM Melissa Rosario, 57 year old female returns for follow-up with history of strokelike symptoms in March 2018.  She received IV TPA.  MRI with no evidence of acute stroke. MRA of the brain showed no significant intracranial and carotid ultrasound showed no significant extracranial stenosis. Transthoracic echo showed normal ejection fraction without cardiac source of embolism.  She remains on aspirin for secondary stroke prevention without further stroke or TIA symptoms.  She is on Lipitor without myalgias.  Labs are followed care.  Diabetes has not been well controlled, CBGs 140 and Up.  She is due to see an endocrinologist.  She has obstructive sleep apnea but does not use her CPAP because  mask does not fit.  She just had a laser on her right eye, she is due to have the left eye done soon.  She also  has bilateral cataracts.  Her headaches are controlled on Topamax.  She has about 1 headache per week.  Blood pressure in the office today 143/90, she has not taken her blood pressure medicine this morning.  Currently is  having a lot of abdominal pain and is having a CT later this afternoon.  She returns for reevaluation UPDATE 12/11/2019CM Melissa Rosario, 57 year old female returns for follow-up with history of strokelike symptoms in March 2018.  When last seen she was on aspirin 0.81 but she claims her primary care took her off of that.  She has not had further stroke or TIA symptoms.  She remains on Lipitor without myalgias most recent hemoglobin A1c 6.1.  Since last seen she has had an esophagus stretching and laser surgery to relieve pressure from glaucoma bilaterally.  Patient also states that she has a history of obstructive sleep apnea but has never used her CPAP.  She is morbidly obese.  She also has a history of headaches and is currently on Topamax 150 mg daily.  She is now complaining with more morning headaches.  She returns for reevaluation REVIEW OF SYSTEMS: Full 14 system review of systems performed and notable only for those listed, all others are neg:  Constitutional: Fatigue Cardiovascular: neg Ear/Nose/Throat: neg  Skin: neg Eyes: Light sensitivity Respiratory: neg Gastroitestinal:neg Hematology/Lymphatic: neg  Endocrine: neg Musculoskeletal:neg Allergy/Immunology: neg Neurological: Headache Psychiatric: neg Sleep : History of obstructive sleep apnea not compliant   ALLERGIES: Allergies  Allergen Reactions  . Hydromorphone Hcl Nausea And Vomiting  . Lovastatin Other (See Comments)    myalgia  . Nsaids     HOME MEDICATIONS: Outpatient Medications Prior to Visit  Medication Sig Dispense Refill  . amLODipine (NORVASC) 5 MG tablet TAKE 2 TABLETS BY MOUTH DAILY.  PATIENT MUST HAVE OFFICE VISIT PRIOR TO ANY FURTHER REFILLS 60 tablet 0  . atorvastatin (LIPITOR) 10 MG tablet Take 1 tablet (10 mg total) by mouth daily. PATIENT MUST HAVE OFFICE VISIT PRIOR TO ANY FURTHER REFILLS 30 tablet 0  . Blood Glucose Monitoring Suppl (ONE TOUCH ULTRA 2) w/Device KIT Check glucose 1-2 times daily 1 each 0    . brimonidine (ALPHAGAN) 0.15 % ophthalmic solution Place 1 drop into both eyes 3 (three) times daily.    . clobetasol ointment (TEMOVATE) 1.61 % Apply 1 application topically 2 (two) times daily.    . cycloSPORINE (RESTASIS) 0.05 % ophthalmic emulsion Place 1 drop into both eyes 4 (four) times daily.    Marland Kitchen glucose blood (ONE TOUCH ULTRA TEST) test strip USE TO TEST 2 TIMES DAILY WITH ONE TOUCH METER 100 each 1  . latanoprost (XALATAN) 0.005 % ophthalmic solution Place 1 drop into both eyes at bedtime.    Marland Kitchen lisinopril-hydrochlorothiazide (PRINZIDE,ZESTORETIC) 20-25 MG tablet Take 1 tablet by mouth daily. 90 tablet 3  . omeprazole (PRILOSEC) 40 MG capsule Take 1 capsule (40 mg total) by mouth 2 (two) times daily. 180 capsule 1  . ondansetron (ZOFRAN-ODT) 8 MG disintegrating tablet Take 1 tablet (8 mg total) by mouth every 8 (eight) hours as needed for nausea or vomiting. 20 tablet 0  . ONETOUCH DELICA LANCETS FINE MISC USE TO TEST SUGAR 1-2 TIMES DAILY 100 each 1  . timolol (BETIMOL) 0.5 % ophthalmic solution Place 1 drop into both eyes 2 (two) times daily.    Marland Kitchen topiramate (TOPAMAX) 50 MG tablet Take 1 tablet (50 mg total) by mouth 3 (three) times daily as needed. (Patient taking differently: Take 100 mg by mouth  2 (two) times daily. ) 270 tablet 0  . aspirin EC 81 MG tablet Take 81 mg by mouth daily.    Marland Kitchen glipiZIDE (GLUCOTROL XL) 5 MG 24 hr tablet Take 1 tablet (5 mg total) by mouth daily with breakfast. (Patient not taking: Reported on 03/05/2018) 90 tablet 3  . JANUVIA 100 MG tablet TAKE 1 TABLET BY MOUTH EVERY DAY (Patient not taking: Reported on 03/05/2018) 90 tablet 0  . Vitamin D, Ergocalciferol, (DRISDOL) 50000 units CAPS capsule Take 1 capsule (50,000 Units total) by mouth every 7 (seven) days. Patient needs repeat labs prior to any further refills. (Patient not taking: Reported on 03/05/2018) 6 capsule 0   Facility-Administered Medications Prior to Visit  Medication Dose Route Frequency  Provider Last Rate Last Dose  . 0.9 %  sodium chloride infusion  500 mL Intravenous Once Ladene Artist, MD        PAST MEDICAL HISTORY: Past Medical History:  Diagnosis Date  . Anxiety   . Depression   . Diabetes mellitus without complication ([place])   . Fibromyalgia   . Glaucoma   . Headache   . Hearing loss   . Hyperlipidemia   . Hypertension   . Sleep apnea    wears CPAP  . Stroke [facility])     PAST SURGICAL HISTORY: Past Surgical History:  Procedure Laterality Date  . APPENDECTOMY  1982  . CHOLECYSTECTOMY  1982  . EYE SURGERY Right 04/09/2017  05/20/17   drain fluid from eye  . SHUNT REPLACEMENT  2016  . SHUNT REVISION  2018  . STAPEDES SURGERY Bilateral 2007  . TONSILLECTOMY  1974  . VENTRICULOPERITONEAL SHUNT  10/2015    FAMILY HISTORY: Family History  Problem Relation Age of Onset  . Hypertension Mother   . Alzheimer's disease Mother   . Lung cancer Father   . Hypertension Father   . Hypertension Sister   . Alcoholism Brother   . Hypertension Brother   . Diabetes Brother   . Stroke Brother   . Heart attack Maternal Grandfather   . Heart attack Paternal Grandfather   . Hypertension Sister   . Diabetes Brother   . Diabetes Maternal Grandmother   . Esophageal cancer Maternal Uncle   . Colon cancer Neg Hx     SOCIAL HISTORY: Social History   Socioeconomic History  . Marital status: Married    Spouse name: Not on file  . Number of children: 3  . Years of education: Not on file  . Highest education level: Not on file  Occupational History  . Occupation: Unemployed  Social Needs  . Financial resource strain: Not on file  . Food insecurity:    Worry: Not on file    Inability: Not on file  . Transportation needs:    Medical: Not on file    Non-medical: Not on file  Tobacco Use  . Smoking status: Never Smoker  . Smokeless tobacco: Never Used  Substance and Sexual Activity  . Alcohol use: No  . Drug use: No  . Sexual activity: Yes    Partners:  Male    Birth control/protection: None  Lifestyle  . Physical activity:    Days per week: Not on file    Minutes per session: Not on file  . Stress: Not on file  Relationships  . Social connections:    Talks on phone: Not on file    Gets together: Not on file    Attends religious service: Not on file  Active member of club or organization: Not on file    Attends meetings of clubs or organizations: Not on file    Relationship status: Not on file  . Intimate partner violence:    Fear of current or ex partner: Not on file    Emotionally abused: Not on file    Physically abused: Not on file    Forced sexual activity: Not on file  Other Topics Concern  . Not on file  Social History Narrative  . Not on file     PHYSICAL EXAM  Vitals:   03/05/18 0821  BP: 126/76  Pulse: 78  Weight: 220 lb 6.4 oz (100 kg)  Height: '5\' 1"'  (1.549 m)   Body mass index is 41.64 kg/m.  Generalized: Well developed, morbidly obese female in no acute distress  Head: normocephalic and atraumatic,. Oropharynx benign  Neck: Supple, no carotid bruits  Cardiac: Regular rate rhythm, no murmur  Musculoskeletal: No deformity   Neurological examination   Mentation: Alert oriented to time, place, history taking. Attention span and concentration appropriate. Recent and remote memory intact.  Follows all commands speech and language fluent. ESS 9  Cranial nerve II-XII: Pupils were equal round reactive to light extraocular movements were full, visual field were full on confrontational test. Facial sensation and strength were normal. hearing was intact to finger rubbing bilaterally. Uvula tongue midline. head turning and shoulder shrug were normal and symmetric.Tongue protrusion into cheek strength was normal. Motor: normal bulk and tone, full strength in the BUE, BLE, Sensory: normal and symmetric to light touch,  in the upper and lower extremities Coordination: finger-nose-finger, heel-to-shin bilaterally,  no dysmetria, no tremor Reflexes: 1+ upper lower and symmetric plantar responses were flexor bilaterally. Gait and Station: Rising up from seated position without assistance, normal stance,  moderate stride, good arm swing, smooth turning, able to perform tiptoe, and heel walking without difficulty. Tandem gait is steady.  Romberg negative  DIAGNOSTIC DATA (LABS, IMAGING, TESTING) - I reviewed patient records, labs, notes, testing and imaging myself where available.  Lab Results  Component Value Date   WBC 10.9 (H) 02/25/2017   HGB 13.6 02/25/2017   HCT 40.4 02/25/2017   MCV 89 02/25/2017   PLT 241 02/25/2017      Component Value Date/Time   NA 142 02/25/2017 0855   K 4.5 02/25/2017 0855   CL 105 02/25/2017 0855   CO2 23 02/25/2017 0855   GLUCOSE 170 (H) 02/25/2017 0855   GLUCOSE 162 (H) 06/10/2016 1217   BUN 21 02/25/2017 0855   CREATININE 0.88 02/25/2017 0855   CALCIUM 9.6 02/25/2017 0855   PROT 7.1 02/25/2017 0855   ALBUMIN 4.4 02/25/2017 0855   AST 14 02/25/2017 0855   ALT 15 02/25/2017 0855   ALKPHOS 74 02/25/2017 0855   BILITOT <0.2 02/25/2017 0855   GFRNONAA 73 02/25/2017 0855   GFRAA 84 02/25/2017 0855   Lab Results  Component Value Date   CHOL 160 02/25/2017   HDL 56 02/25/2017   LDLCALC 81 02/25/2017   TRIG 114 02/25/2017   CHOLHDL 2.9 02/25/2017   Lab Results  Component Value Date   HGBA1C 7.4 04/03/2017    Lab Results  Component Value Date   TSH 2.200 11/28/2016      ASSESSMENT AND PLAN 57 year lady with strokelike episode in March 2018 13 of unclear etiology treated with IV TPA. Chronic recurrent headaches likely mixed migraine and tension headaches which appear to be in fair  controll on the  current medication regime.new complaint of daytime somnolence and morning headache.  Patient has a history of obstructive sleep apnea but has been noncompliant with her CPAP.  Sleep study was over 7 years ago according to the patient.  She patient is a current  patient of Dr. Leonie Man  who is out of the office today . This note is sent to the work in doctor.       PLAN:Stressed the importance of management of risk factors to prevent further stroke Recommend restarting aspirin .53m for secondary stroke prevention Maintain strict control of hypertension with blood pressure goal below 130/90, today's reading 126/76 continue antihypertensive medications Control of diabetes with hemoglobin A1c below 6.5 followed by primary care continue diabetic medications most recent hgb A1c 6.1 according to pt. Cholesterol with LDL cholesterol less than 70, followed by primary care,   continue statin drug Lipitor  Increase Topamax to 105mtwice daily for headache/migraine  Exercise by walking, 30 minutes daily  eat healthy diet with whole grains,  fresh fruits and vegetables Will order sleep study/evaluation F/U in 6 months  I spent 25 minutes in total face to face time with the patient more than 50% of which was spent counseling and coordination of care, reviewing test results reviewing medications and discussing and reviewing the diagnosis of stroke and management of risk factors and further treatment options.  Also discussed nontreated obstructive sleep apnea,  answered questions for patient and the husband, NaDennie BibleGNLakeview Behavioral Health SystemBCAthens Eye Surgery CenterAPYamhilleurologic Associates [address] StreetSuVictorrJamestownNC [phone]

## 2018-03-05 ENCOUNTER — Encounter: Payer: Self-pay | Admitting: Nurse Practitioner

## 2018-03-05 ENCOUNTER — Ambulatory Visit: Payer: Medicare HMO | Admitting: Nurse Practitioner

## 2018-03-05 VITALS — BP 126/76 | HR 78 | Ht 61.0 in | Wt 220.4 lb

## 2018-03-05 DIAGNOSIS — G4733 Obstructive sleep apnea (adult) (pediatric): Secondary | ICD-10-CM | POA: Diagnosis not present

## 2018-03-05 DIAGNOSIS — E785 Hyperlipidemia, unspecified: Secondary | ICD-10-CM | POA: Diagnosis not present

## 2018-03-05 DIAGNOSIS — G8929 Other chronic pain: Secondary | ICD-10-CM

## 2018-03-05 DIAGNOSIS — I10 Essential (primary) hypertension: Secondary | ICD-10-CM | POA: Diagnosis not present

## 2018-03-05 DIAGNOSIS — R2990 Unspecified symptoms and signs involving the nervous system: Secondary | ICD-10-CM

## 2018-03-05 DIAGNOSIS — I639 Cerebral infarction, unspecified: Secondary | ICD-10-CM | POA: Diagnosis not present

## 2018-03-05 DIAGNOSIS — R519 Headache, unspecified: Secondary | ICD-10-CM | POA: Insufficient documentation

## 2018-03-05 DIAGNOSIS — R51 Headache: Secondary | ICD-10-CM | POA: Diagnosis not present

## 2018-03-05 MED ORDER — TOPIRAMATE 50 MG PO TABS: 100.0000 mg | ORAL_TABLET | Freq: Two times a day (BID) | ORAL | 6 refills | Status: DC

## 2018-03-05 NOTE — Patient Instructions (Signed)
Stressed the importance of management of risk factors to prevent further stroke Recommend  aspirin for secondary stroke prevention Maintain strict control of hypertension with blood pressure goal below 130/90, today's reading 126/76 continue antihypertensive medications Control of diabetes with hemoglobin A1c below 6.5 followed by primary care continue diabetic medications most recent hgb A1c 6.1 according to pt. Cholesterol with LDL cholesterol less than 70, followed by primary care,   continue statin drug Lipitor  Increase Topamax to 100mg  twice daily for headache/migraine  Exercise by walking, 30 minutes daily  eat healthy diet with whole grains,  fresh fruits and vegetables F/U in 6 months

## 2018-03-05 NOTE — Progress Notes (Signed)
I have read the note, and I agree with the clinical assessment and plan.  Charles K Willis   

## 2018-03-05 NOTE — Progress Notes (Signed)
AVS mailed to pt.

## 2018-03-12 ENCOUNTER — Other Ambulatory Visit: Payer: Self-pay | Admitting: Neurology

## 2018-03-12 ENCOUNTER — Other Ambulatory Visit: Payer: Self-pay | Admitting: Internal Medicine

## 2018-03-12 DIAGNOSIS — R2990 Unspecified symptoms and signs involving the nervous system: Secondary | ICD-10-CM

## 2018-03-12 DIAGNOSIS — I639 Cerebral infarction, unspecified: Principal | ICD-10-CM

## 2018-04-07 ENCOUNTER — Other Ambulatory Visit: Payer: Self-pay

## 2018-04-07 MED ORDER — GLUCOSE BLOOD VI STRP: ORAL_STRIP | 1 refills | Status: AC

## 2018-05-05 ENCOUNTER — Ambulatory Visit: Payer: Self-pay | Admitting: Nurse Practitioner

## 2018-05-19 ENCOUNTER — Encounter

## 2018-07-05 ENCOUNTER — Other Ambulatory Visit: Payer: Self-pay | Admitting: Internal Medicine

## 2018-09-02 ENCOUNTER — Telehealth: Payer: Self-pay

## 2018-09-02 NOTE — Telephone Encounter (Signed)
Pt gives consent for video visit and to file insurance .  [email or web address]  [email or web address]  Link sent to cell number above , pt states confirmation

## 2018-09-02 NOTE — Telephone Encounter (Signed)
VM left for pt that appt will be change to video due to COVID 19 pandemic. We need to have verbal consent to do video and to file insurance from pt. If pt has a cell phone, computer or ipad with camera that can work. Need to know her cell phone carrier to send link to and her email address.

## 2018-09-04 ENCOUNTER — Other Ambulatory Visit: Payer: Self-pay

## 2018-09-04 ENCOUNTER — Ambulatory Visit (INDEPENDENT_AMBULATORY_CARE_PROVIDER_SITE_OTHER): Payer: Medicare Other | Admitting: Adult Health

## 2018-09-04 ENCOUNTER — Encounter: Payer: Self-pay | Admitting: Adult Health

## 2018-09-04 DIAGNOSIS — G4733 Obstructive sleep apnea (adult) (pediatric): Secondary | ICD-10-CM | POA: Diagnosis not present

## 2018-09-04 DIAGNOSIS — E1159 Type 2 diabetes mellitus with other circulatory complications: Secondary | ICD-10-CM

## 2018-09-04 DIAGNOSIS — G8929 Other chronic pain: Secondary | ICD-10-CM

## 2018-09-04 DIAGNOSIS — E785 Hyperlipidemia, unspecified: Secondary | ICD-10-CM | POA: Diagnosis not present

## 2018-09-04 DIAGNOSIS — R2990 Unspecified symptoms and signs involving the nervous system: Secondary | ICD-10-CM

## 2018-09-04 DIAGNOSIS — R519 Headache, unspecified: Secondary | ICD-10-CM

## 2018-09-04 DIAGNOSIS — I639 Cerebral infarction, unspecified: Secondary | ICD-10-CM | POA: Diagnosis not present

## 2018-09-04 DIAGNOSIS — I10 Essential (primary) hypertension: Secondary | ICD-10-CM | POA: Diagnosis not present

## 2018-09-04 DIAGNOSIS — E1165 Type 2 diabetes mellitus with hyperglycemia: Secondary | ICD-10-CM

## 2018-09-04 DIAGNOSIS — R51 Headache: Secondary | ICD-10-CM

## 2018-09-04 NOTE — Progress Notes (Signed)
[facility]  PATIENT: Melissa Rosario DOB: [birth year]   REASON FOR VISIT: Follow-up for strokelike episodes, March 2018 Headaches, morning headache, history of obstructive sleep apnea noncompliant with CPAP HISTORY FROM: Patient  Virtual Visit via Video Note  I connected with Melissa Rosario on 09/04/18 at  8:15 AM EDT by a video enabled telemedicine application located remotely in my own home and verified that I am speaking with the correct person using two identifiers who was located at their own home.   I discussed the limitations of evaluation and management by telemedicine and the availability of in person appointments. The patient expressed understanding and agreed to proceed.    HISTORY OF PRESENT ILLNESS: Melissa Rosario a 58 y.o.femalewith a hx of VP shunt placed 2 years ago, HTN, HLD, DM, OSA with noncompliance of CPAP therapy, bilateral cataracts and migraines who presented on 06/10/2016 with left retroorbital pain and left facial numbness and slurred speech. She was rushed to the ED as a code stroke.She received IV tPA 06/10/2016 at 1244. She remained neurologically stable and made a full recovery. MRI scan of the brain showed no evidence of acute infarct. Right frontal ventricular peritoneal shunt catheter was noted. Ventricular size was not enlarged.  Vessel imaging unremarkable.  2D echo unremarkable.  LDL 127 and A1c 6.4.  Recommended aspirin for secondary stroke prevention and discharged home in stable condition.  Melissa Rosario is being seen today for stroke follow-up with prior visit on 03/05/2018.  She has been stable from a stroke standpoint without new or recurring stroke/TIA symptoms.  She continues on topamax 174m BID. She does endorse having some headaches which may be related to weather such as increased pressure over occipital and frontal areas. Migraines occur approx twice monthly which are different from weather related headaches. Migraines can be  debilitating with pain level 7/10 and will need to lay down in a dark room. Typically last 3-4 hours. Located temporal.  [facility] underlying history of OSA without current use of CPAP and complaints of morning headaches.  Per referral notes, patient unable to be contacted to schedule initial visit. The last time she had a sleep study 5-6 years prior. She does endorse having a lot of personal stress over the past couple of years with her mother passing after [place]. She was dx with stroke, sleep apnea, glucoma all at once. She has been having difficulty with hearing as well. She becomes tearful speaking of all of this during visit. She plans on undergoing cataract surgery Monday. She feels very overwhelmed with all of her health conditions and plans on scheduling an initial sleep evaluation after cataract procedure.  Continues on aspirin 81 mg and atorvastatin 10 mg daily for secondary stroke prevention without reported side effects.  No further concerns at this time.  Denies new or worsening stroke/TIA symptoms.      REVIEW OF SYSTEMS: Full 14 system review of systems performed and notable only for those listed, all others are neg:  Headaches   ALLERGIES: Allergies  Allergen Reactions   Hydromorphone Hcl Nausea And Vomiting   Lovastatin Other (See Comments)    myalgia   Nsaids     HOME MEDICATIONS: Outpatient Medications Prior to Visit  Medication Sig Dispense Refill   amLODipine (NORVASC) 5 MG tablet TAKE 2 TABLETS BY MOUTH DAILY.  PATIENT MUST HAVE OFFICE VISIT PRIOR TO ANY FURTHER REFILLS 60 tablet 0   aspirin EC 81 MG tablet Take 81  mg by mouth daily.     atorvastatin (LIPITOR) 10 MG tablet Take 1 tablet (10 mg total) by mouth daily. PATIENT MUST HAVE OFFICE VISIT PRIOR TO ANY FURTHER REFILLS 30 tablet 0   Blood Glucose Monitoring Suppl (ONE TOUCH ULTRA 2) w/Device KIT Check glucose 1-2 times daily 1 each 0   brimonidine  (ALPHAGAN) 0.15 % ophthalmic solution Place 1 drop into both eyes 3 (three) times daily.     clobetasol ointment (TEMOVATE) 4.49 % Apply 1 application topically 2 (two) times daily.     cycloSPORINE (RESTASIS) 0.05 % ophthalmic emulsion Place 1 drop into both eyes 4 (four) times daily.     glipiZIDE (GLUCOTROL XL) 5 MG 24 hr tablet TAKE 1 TABLET BY MOUTH EVERY DAY WITH BREAKFAST 90 tablet 0   glucose blood (ONE TOUCH ULTRA TEST) test strip USE TO TEST 2 TIMES DAILY WITH ONE TOUCH METER 100 each 1   JANUVIA 100 MG tablet TAKE 1 TABLET BY MOUTH EVERY DAY (Patient not taking: Reported on 03/05/2018) 90 tablet 0   latanoprost (XALATAN) 0.005 % ophthalmic solution Place 1 drop into both eyes at bedtime.     lisinopril-hydrochlorothiazide (PRINZIDE,ZESTORETIC) 20-25 MG tablet Take 1 tablet by mouth daily. 90 tablet 3   omeprazole (PRILOSEC) 40 MG capsule Take 1 capsule (40 mg total) by mouth 2 (two) times daily. 180 capsule 1   ondansetron (ZOFRAN-ODT) 8 MG disintegrating tablet Take 1 tablet (8 mg total) by mouth every 8 (eight) hours as needed for nausea or vomiting. 20 tablet 0   ONETOUCH DELICA LANCETS FINE MISC USE TO TEST SUGAR 1-2 TIMES DAILY 100 each 1   timolol (BETIMOL) 0.5 % ophthalmic solution Place 1 drop into both eyes 2 (two) times daily.     topiramate (TOPAMAX) 50 MG tablet Take 2 tablets (100 mg total) by mouth 2 (two) times daily. 120 tablet 6   Vitamin D, Ergocalciferol, (DRISDOL) 50000 units CAPS capsule Take 1 capsule (50,000 Units total) by mouth every 7 (seven) days. Patient needs repeat labs prior to any further refills. (Patient not taking: Reported on 03/05/2018) 6 capsule 0   Facility-Administered Medications Prior to Visit  Medication Dose Route Frequency Provider Last Rate Last Dose   0.9 %  sodium chloride infusion  500 mL Intravenous Once Ladene Artist, MD        PAST MEDICAL HISTORY: Past Medical History:  Diagnosis Date   Anxiety    Depression      Diabetes mellitus without complication ([place])    Fibromyalgia    Glaucoma    Headache    Hearing loss    Hyperlipidemia    Hypertension    Sleep apnea    wears CPAP   Stroke [facility])     PAST SURGICAL HISTORY: Past Surgical History:  Procedure Laterality Date   [place] Right 04/09/2017  05/20/17   drain fluid from eye   SHUNT REPLACEMENT  2016   SHUNT REVISION  2018   STAPEDES SURGERY Bilateral 2007   TONSILLECTOMY  1974   VENTRICULOPERITONEAL SHUNT  10/2015    FAMILY HISTORY: Family History  Problem Relation Age of Onset   Hypertension Mother    Alzheimer's disease Mother    Lung cancer Father    Hypertension Father    Hypertension Sister    Alcoholism Brother    Hypertension Brother    Diabetes Brother    Stroke Brother    Heart  attack Maternal Grandfather    Heart attack Paternal Grandfather    Hypertension Sister    Diabetes Brother    Diabetes Maternal Grandmother    Esophageal cancer Maternal Uncle    Colon cancer Neg Hx     SOCIAL HISTORY: Social History   Socioeconomic History   Marital status: Married    Spouse name: Not on file   Number of children: 3   Years of education: Not on file   Highest education level: Not on file  Occupational History   Occupation: Unemployed  Scientist, product/process development strain: Not on file   Food insecurity    Worry: Not on file    Inability: Not on file   Transportation needs    Medical: Not on file    Non-medical: Not on file  Tobacco Use   Smoking status: Never Smoker   Smokeless tobacco: Never Used  Substance and Sexual Activity   Alcohol use: No   Drug use: No   Sexual activity: Yes    Partners: Male    Birth control/protection: None  Lifestyle   Physical activity    Days per week: Not on file    Minutes per session: Not on file   Stress: Not on file  Relationships   Social connections     Talks on phone: Not on file    Gets together: Not on file    Attends religious service: Not on file    Active member of club or organization: Not on file    Attends meetings of clubs or organizations: Not on file    Relationship status: Not on file   Intimate partner violence    Fear of current or ex partner: Not on file    Emotionally abused: Not on file    Physically abused: Not on file    Forced sexual activity: Not on file  Other Topics Concern   Not on file  Social History Narrative   Not on file     PHYSICAL EXAM  Generalized: Well developed, pleasant middle-age Caucasian female in no acute distress  Head: normocephalic and atraumatic    Neurological examination   Mentation: Alert oriented to time, place, history taking. Attention span and concentration appropriate. Recent and remote memory intact.  Follows all commands speech and language fluent.  Cranial nerve II-XII:  extraocular movements were full. Facial sensation and strength were normal. hearing was intact to voice.  head turning and shoulder shrug were normal and symmetric. Motor: No evidence of weakness per drift assessment Sensory: normal and symmetric to light touch,  in the upper and lower extremities Coordination: finger-nose-finger, heel-to-shin bilaterally, no dysmetria, no tremor Reflexes: UTA Gait and Station: No abnormality noted    ASSESSMENT AND PLAN 28 year lady with strokelike episode in March 2018 13 of unclear etiology treated with IV TPA. Chronic recurrent headaches likely mixed migraine and tension headaches. Patient has a history of obstructive sleep apnea but has been noncompliant with her CPAP.  Sleep study was over 7 years ago according to the patient at a [facility] facility.  Migraines overall well managed with only 2/month.  Stable from stroke standpoint without residual deficit or new/worsening stroke/TIA symptoms.    PLAN: -Continue aspirin 81 mg and atorvastatin 10 mg daily  for secondary stroke prevention -Ongoing follow-up with PCP for HTN, HLD and DM management maintain strict control of  -Continue Topamax 143m twice daily for headache/migraine -refill placed -Recommend initiating ubrevly 50 mg as needed for  acute migraine management -triptans are contraindicated due to prior history of stroke and cardiovascular disease -She plans on calling [facility] to schedule initial evaluation -Maintain strict control of hypertension with blood pressure goal below 130/90, diabetes with hemoglobin A1c goal below 6.5% and cholesterol with LDL cholesterol (bad cholesterol) goal below 70 mg/dL. I also advised the patient to eat a healthy diet with plenty of whole grains, cereals, fruits and vegetables, exercise regularly and maintain ideal body weight.  F/U in 6 months or call earlier if needed  I spent 25 minutes in total non-face to face time with the patient more than 50% of which was spent counseling and coordination of care, reviewing test results reviewing medications and discussing and reviewing the diagnosis of stroke and management of risk factors and further treatment options.  Also discussed nontreated obstructive sleep apnea along with ongoing migraines, and answered all questions to patient satisfaction  Venancio Poisson, AGNP-BC  [facility] [address] [place], [place] [address]  Phone [phone] Fax [phone] Note: This document was prepared with digital dictation and possible smart phrase technology. Any transcriptional errors that result from this process are unintentional.

## 2018-09-04 NOTE — Patient Instructions (Signed)
Headache  Sleep apnea testing

## 2018-09-08 ENCOUNTER — Encounter: Payer: Self-pay | Admitting: Adult Health

## 2018-09-08 MED ORDER — UBRELVY 50 MG PO TABS: 50.0000 mg | ORAL_TABLET | ORAL | 2 refills | Status: DC | PRN

## 2018-09-08 MED ORDER — TOPIRAMATE 50 MG PO TABS: 100.0000 mg | ORAL_TABLET | Freq: Two times a day (BID) | ORAL | 6 refills | Status: DC

## 2018-09-08 NOTE — Progress Notes (Signed)
I agree with the above plan 

## 2019-01-04 ENCOUNTER — Other Ambulatory Visit: Payer: Self-pay | Admitting: Adult Health

## 2019-01-04 DIAGNOSIS — R2990 Unspecified symptoms and signs involving the nervous system: Secondary | ICD-10-CM

## 2019-01-05 ENCOUNTER — Telehealth: Payer: Self-pay

## 2019-01-05 DIAGNOSIS — R2990 Unspecified symptoms and signs involving the nervous system: Secondary | ICD-10-CM

## 2019-01-05 MED ORDER — TOPIRAMATE 50 MG PO TABS: 100.0000 mg | ORAL_TABLET | Freq: Two times a day (BID) | ORAL | 1 refills | Status: DC

## 2019-01-05 NOTE — Telephone Encounter (Signed)
Made in error

## 2019-01-20 ENCOUNTER — Telehealth: Payer: Self-pay | Admitting: Adult Health

## 2019-01-20 DIAGNOSIS — R2990 Unspecified symptoms and signs involving the nervous system: Secondary | ICD-10-CM

## 2019-02-03 MED ORDER — TOPIRAMATE 50 MG PO TABS: 100.0000 mg | ORAL_TABLET | Freq: Two times a day (BID) | ORAL | 1 refills | Status: DC

## 2019-02-03 NOTE — Telephone Encounter (Signed)
Pt called in and stated she no longer lives in [place] so the [facility]day supply that [facility] [place] needs to be canceled so that she can receive her meds through [facility].

## 2019-02-03 NOTE — Addendum Note (Signed)
Addended by: Brandon Melnick on: 02/03/2019 12:08 PM   Modules accepted: Orders

## 2019-02-03 NOTE — Telephone Encounter (Signed)
I called and LMVM for [facility] [place], that topiramate prescription escribed 10/20 please cancel along with remaining refills as pt wants to go to [facility].

## 2019-02-05 MED ORDER — UBRELVY 50 MG PO TABS: 50.0000 mg | ORAL_TABLET | ORAL | 2 refills | Status: AC | PRN

## 2019-02-05 NOTE — Addendum Note (Signed)
Addended by: Brandon Melnick on: 02/05/2019 12:01 PM   Modules accepted: Orders

## 2019-02-05 NOTE — Telephone Encounter (Signed)
Pt called wanting to know the update on this. She states she is needing this soon. Please advise.

## 2019-02-05 NOTE — Telephone Encounter (Signed)
I called pt and told her that prescription [facility] on [facility] (noted received).  She asked about migraine medication last prescribed Roselyn Meier).  Unfortunately it did not go thru to pharmacy.  I instructed that she will have to go online to download card for savings on this drug.  I [facility]ribed [facility].  She will call back as needed.

## 2019-03-10 ENCOUNTER — Ambulatory Visit: Payer: Medicare Other | Admitting: Adult Health

## 2019-06-16 ENCOUNTER — Other Ambulatory Visit: Payer: Self-pay | Admitting: Adult Health

## 2019-06-16 DIAGNOSIS — R2990 Unspecified symptoms and signs involving the nervous system: Secondary | ICD-10-CM
